# Patient Record
Sex: Female | Born: 1950
Health system: Southern US, Community
[De-identification: ages and names within clinical notes are randomized; demographics above are authoritative.]

## PROBLEM LIST (undated history)

## (undated) DIAGNOSIS — J45909 Unspecified asthma, uncomplicated: Secondary | ICD-10-CM

## (undated) DIAGNOSIS — R011 Cardiac murmur, unspecified: Secondary | ICD-10-CM

## (undated) DIAGNOSIS — I10 Essential (primary) hypertension: Principal | ICD-10-CM

## (undated) DIAGNOSIS — M810 Age-related osteoporosis without current pathological fracture: Secondary | ICD-10-CM

## (undated) DIAGNOSIS — G454 Transient global amnesia: Secondary | ICD-10-CM

## (undated) HISTORY — DX: Essential (primary) hypertension: I10

## (undated) HISTORY — PX: OTHER SURGICAL HISTORY: SHX169

## (undated) HISTORY — PX: COLONOSCOPY: SHX174

## (undated) HISTORY — DX: Unspecified asthma, uncomplicated: J45.909

## (undated) HISTORY — DX: Transient global amnesia: G45.4

## (undated) HISTORY — DX: Age-related osteoporosis without current pathological fracture: M81.0

---

## 2000-12-17 ENCOUNTER — Encounter: Admission: RE | Admit: 2000-12-17 | Discharge: 2000-12-17 | Payer: Self-pay | Admitting: Family Medicine

## 2000-12-17 ENCOUNTER — Encounter: Payer: Self-pay | Admitting: Family Medicine

## 2001-01-11 ENCOUNTER — Encounter: Admission: RE | Admit: 2001-01-11 | Discharge: 2001-01-11 | Payer: Self-pay | Admitting: Family Medicine

## 2001-01-11 ENCOUNTER — Encounter: Payer: Self-pay | Admitting: Family Medicine

## 2002-01-14 ENCOUNTER — Encounter: Payer: Self-pay | Admitting: Family Medicine

## 2002-01-14 ENCOUNTER — Encounter: Admission: RE | Admit: 2002-01-14 | Discharge: 2002-01-14 | Payer: Self-pay | Admitting: Family Medicine

## 2002-08-09 ENCOUNTER — Emergency Department (HOSPITAL_COMMUNITY): Admission: EM | Admit: 2002-08-09 | Discharge: 2002-08-09 | Payer: Self-pay | Admitting: *Deleted

## 2002-08-09 ENCOUNTER — Encounter: Payer: Self-pay | Admitting: *Deleted

## 2003-01-07 ENCOUNTER — Emergency Department (HOSPITAL_COMMUNITY): Admission: EM | Admit: 2003-01-07 | Discharge: 2003-01-07 | Payer: Self-pay | Admitting: Emergency Medicine

## 2003-01-07 ENCOUNTER — Encounter: Payer: Self-pay | Admitting: Emergency Medicine

## 2003-02-06 ENCOUNTER — Encounter: Admission: RE | Admit: 2003-02-06 | Discharge: 2003-02-06 | Payer: Self-pay | Admitting: *Deleted

## 2003-04-10 ENCOUNTER — Emergency Department (HOSPITAL_COMMUNITY): Admission: EM | Admit: 2003-04-10 | Discharge: 2003-04-11 | Payer: Self-pay | Admitting: Emergency Medicine

## 2003-04-11 ENCOUNTER — Emergency Department (HOSPITAL_COMMUNITY): Admission: AD | Admit: 2003-04-11 | Discharge: 2003-04-11 | Payer: Self-pay | Admitting: Family Medicine

## 2003-05-12 HISTORY — PX: NM MYOVIEW LTD: HXRAD82

## 2003-05-15 ENCOUNTER — Observation Stay (HOSPITAL_COMMUNITY): Admission: EM | Admit: 2003-05-15 | Discharge: 2003-05-16 | Payer: Self-pay | Admitting: Emergency Medicine

## 2003-05-23 ENCOUNTER — Emergency Department (HOSPITAL_COMMUNITY): Admission: EM | Admit: 2003-05-23 | Discharge: 2003-05-24 | Payer: Self-pay | Admitting: Emergency Medicine

## 2004-01-07 ENCOUNTER — Other Ambulatory Visit: Admission: RE | Admit: 2004-01-07 | Discharge: 2004-01-07 | Payer: Self-pay | Admitting: Internal Medicine

## 2004-03-08 ENCOUNTER — Encounter: Admission: RE | Admit: 2004-03-08 | Discharge: 2004-03-08 | Payer: Self-pay | Admitting: Internal Medicine

## 2004-05-10 ENCOUNTER — Ambulatory Visit (HOSPITAL_COMMUNITY): Admission: RE | Admit: 2004-05-10 | Discharge: 2004-05-10 | Payer: Self-pay | Admitting: Gastroenterology

## 2004-10-07 IMAGING — CT CT HEAD W/O CM
1 of 2 series · 13 of 30 positions shown, 17 images · non-contrast
Comparison: none

CLINICAL DATA: Elevated blood pressure.  Left-sided body numbness and weakness.  
CT OF THE HEAD WITHOUT CONTRAST
Routine unenhanced study was performed.  Comparison 01/07/03.  
There is no evidence of acute intracranial hemorrhage, mass effect or extraaxial fluid collection.  The ventricles and subarachnoid spaces are appropriately sized for age.  No stroke or white matter disease is demonstrated.  The visualized paranasal sinuses remain clear.  Calvarium is intact.  
IMPRESSION
Stable unenhanced CT of the head demonstrating no abnormality.  For further assessment of persistent symptomatology, MRI may be helpful.

[Series 2: brain · axial · 0.47mm/px · z∈[+171,+294]mm · 13 of 28 slices shown, 17 images]
[im 2/28  brain]
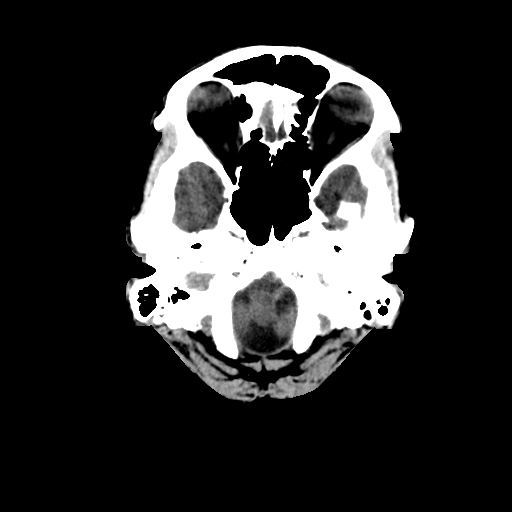
[im 2/28  bone]
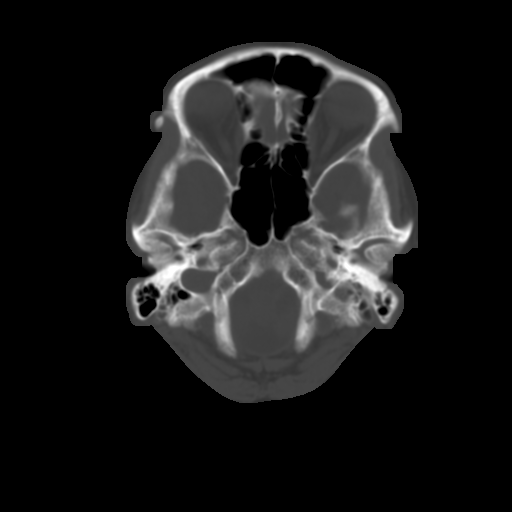
[im 4/28  brain]
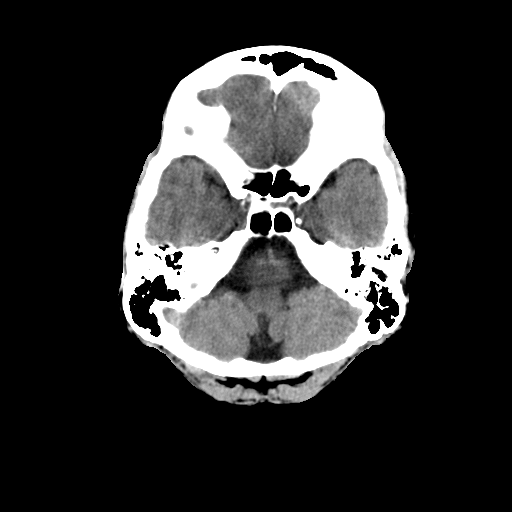
[im 6/28  brain]
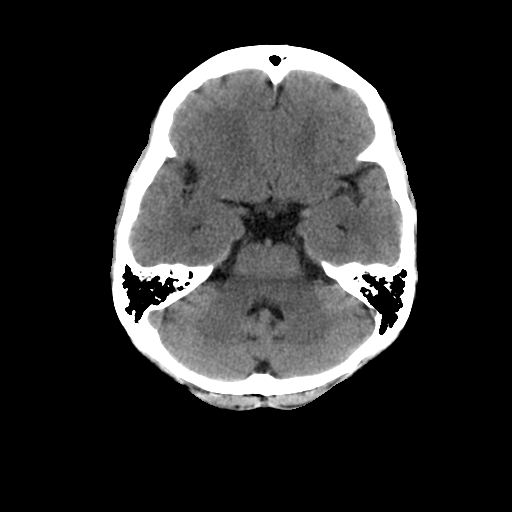
[im 8/28  brain]
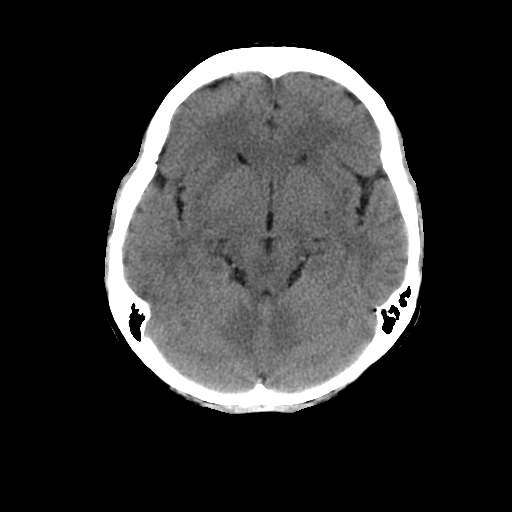
[im 10/28  brain]
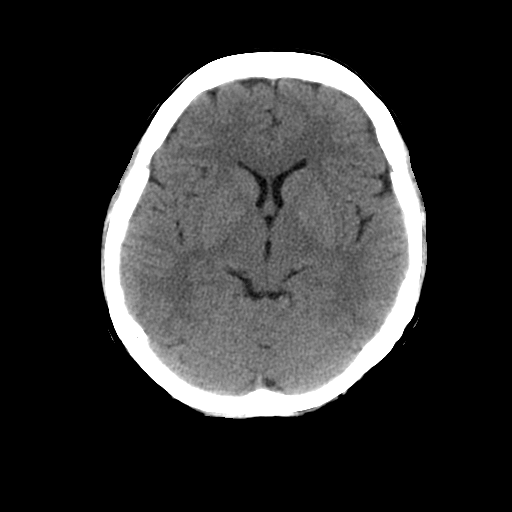
[im 10/28  bone]
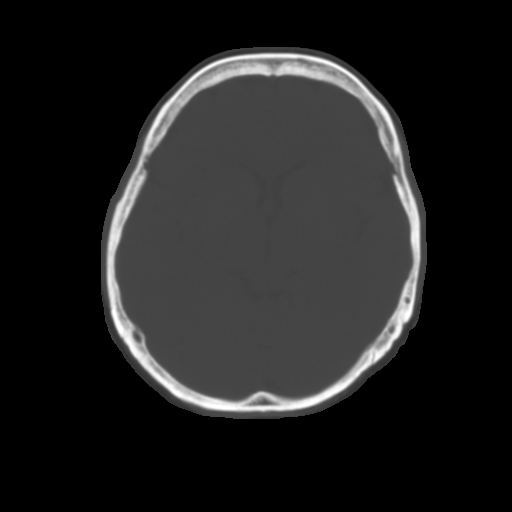
[im 12/28  brain]
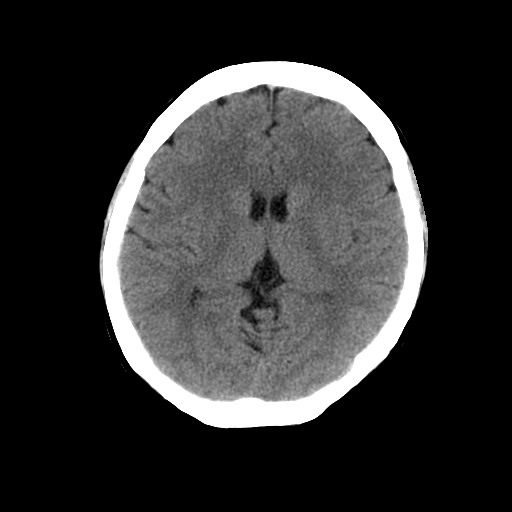
[im 14/28  brain]
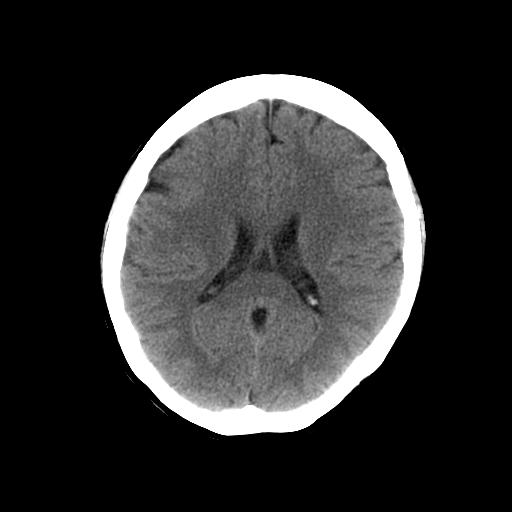
[im 16/28  brain]
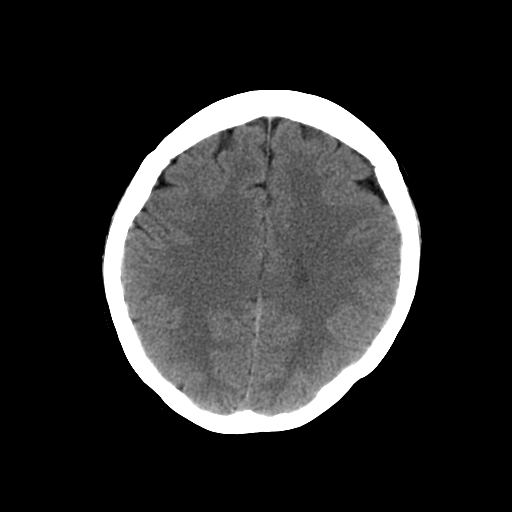
[im 18/28  brain]
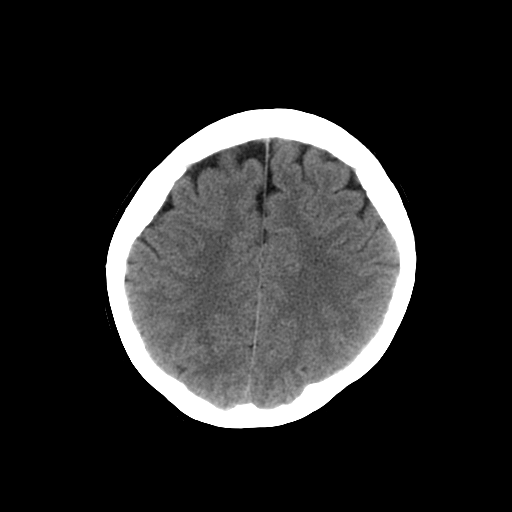
[im 18/28  bone]
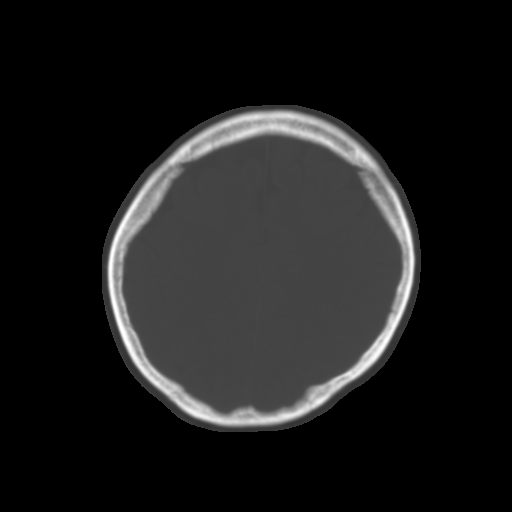
[im 20/28  brain]
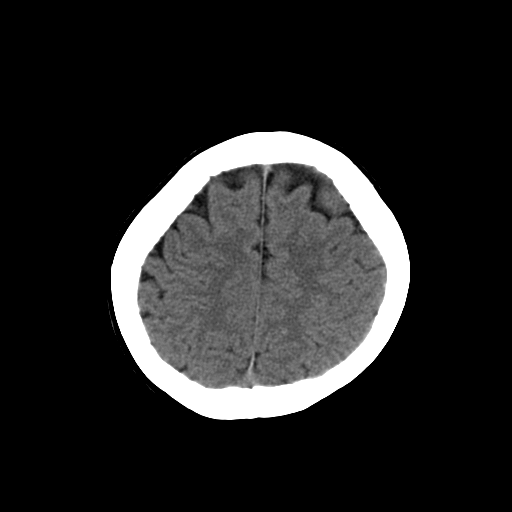
[im 22/28  brain]
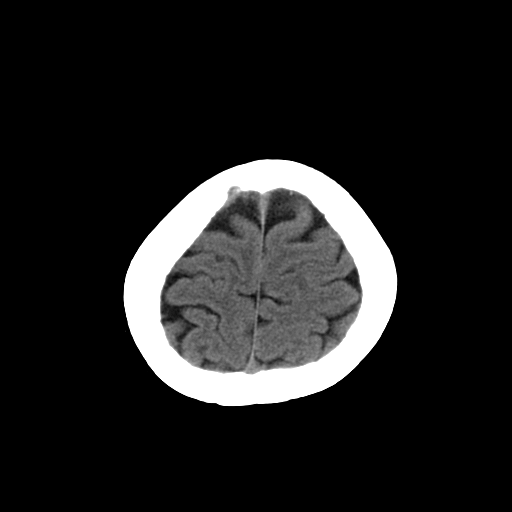
[im 24/28  brain]
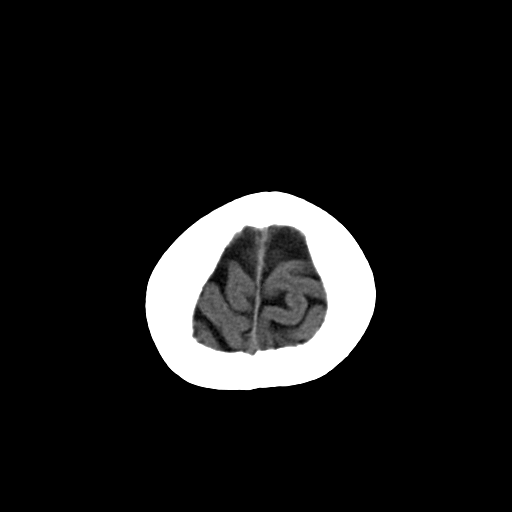
[im 26/28  brain]
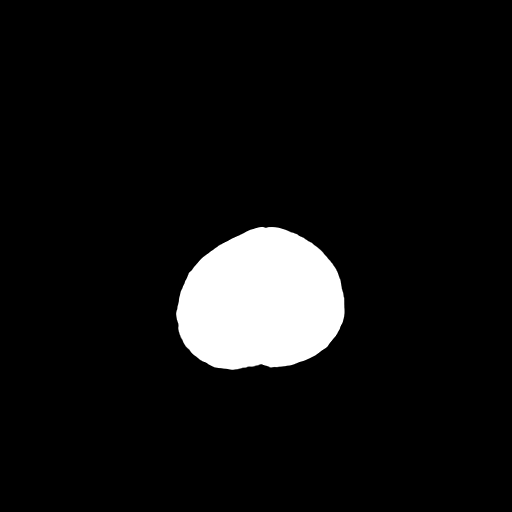
[im 26/28  bone]
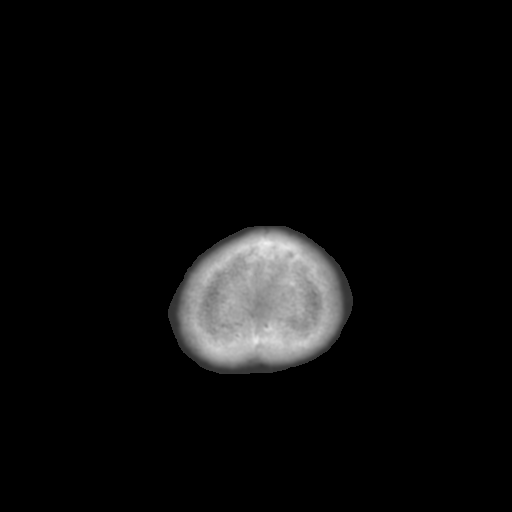

[13 of 30 positions shown; findings below may reference images not displayed]

## 2004-10-10 ENCOUNTER — Emergency Department (HOSPITAL_COMMUNITY): Admission: EM | Admit: 2004-10-10 | Discharge: 2004-10-10 | Payer: Self-pay | Admitting: Family Medicine

## 2005-02-07 ENCOUNTER — Other Ambulatory Visit: Admission: RE | Admit: 2005-02-07 | Discharge: 2005-02-07 | Payer: Self-pay | Admitting: Internal Medicine

## 2005-02-20 ENCOUNTER — Emergency Department (HOSPITAL_COMMUNITY): Admission: EM | Admit: 2005-02-20 | Discharge: 2005-02-20 | Payer: Self-pay | Admitting: Family Medicine

## 2005-03-09 ENCOUNTER — Encounter: Admission: RE | Admit: 2005-03-09 | Discharge: 2005-03-09 | Payer: Self-pay | Admitting: Internal Medicine

## 2005-03-30 ENCOUNTER — Encounter: Admission: RE | Admit: 2005-03-30 | Discharge: 2005-03-30 | Payer: Self-pay | Admitting: Internal Medicine

## 2005-05-03 ENCOUNTER — Emergency Department (HOSPITAL_COMMUNITY): Admission: EM | Admit: 2005-05-03 | Discharge: 2005-05-03 | Payer: Self-pay | Admitting: Family Medicine

## 2006-03-12 ENCOUNTER — Encounter: Admission: RE | Admit: 2006-03-12 | Discharge: 2006-03-12 | Payer: Self-pay | Admitting: Internal Medicine

## 2006-03-14 ENCOUNTER — Other Ambulatory Visit: Admission: RE | Admit: 2006-03-14 | Discharge: 2006-03-14 | Payer: Self-pay | Admitting: Internal Medicine

## 2006-05-01 ENCOUNTER — Emergency Department (HOSPITAL_COMMUNITY): Admission: EM | Admit: 2006-05-01 | Discharge: 2006-05-01 | Payer: Self-pay | Admitting: Family Medicine

## 2006-05-02 ENCOUNTER — Emergency Department (HOSPITAL_COMMUNITY): Admission: EM | Admit: 2006-05-02 | Discharge: 2006-05-02 | Payer: Self-pay | Admitting: Emergency Medicine

## 2006-10-06 ENCOUNTER — Emergency Department (HOSPITAL_COMMUNITY): Admission: EM | Admit: 2006-10-06 | Discharge: 2006-10-06 | Payer: Self-pay | Admitting: Family Medicine

## 2007-03-14 ENCOUNTER — Encounter: Admission: RE | Admit: 2007-03-14 | Discharge: 2007-03-14 | Payer: Self-pay | Admitting: *Deleted

## 2007-04-11 LAB — CONVERTED CEMR LAB: Pap Smear: NORMAL

## 2008-03-25 ENCOUNTER — Encounter: Payer: Self-pay | Admitting: Family Medicine

## 2008-03-25 ENCOUNTER — Encounter: Admission: RE | Admit: 2008-03-25 | Discharge: 2008-03-25 | Payer: Self-pay | Admitting: Family Medicine

## 2008-08-26 ENCOUNTER — Encounter: Payer: Self-pay | Admitting: Family Medicine

## 2008-08-31 ENCOUNTER — Encounter: Payer: Self-pay | Admitting: Family Medicine

## 2008-10-27 DIAGNOSIS — M810 Age-related osteoporosis without current pathological fracture: Secondary | ICD-10-CM

## 2008-10-27 DIAGNOSIS — J45909 Unspecified asthma, uncomplicated: Secondary | ICD-10-CM | POA: Insufficient documentation

## 2008-10-27 DIAGNOSIS — I10 Essential (primary) hypertension: Secondary | ICD-10-CM

## 2008-10-27 HISTORY — DX: Age-related osteoporosis without current pathological fracture: M81.0

## 2008-10-27 HISTORY — DX: Unspecified asthma, uncomplicated: J45.909

## 2008-10-27 HISTORY — DX: Essential (primary) hypertension: I10

## 2008-11-08 HISTORY — PX: TRANSTHORACIC ECHOCARDIOGRAM: SHX275

## 2008-11-11 ENCOUNTER — Ambulatory Visit: Payer: Self-pay | Admitting: Family Medicine

## 2008-11-21 ENCOUNTER — Ambulatory Visit: Payer: Self-pay | Admitting: Internal Medicine

## 2008-11-21 ENCOUNTER — Inpatient Hospital Stay (HOSPITAL_COMMUNITY): Admission: EM | Admit: 2008-11-21 | Discharge: 2008-11-22 | Payer: Self-pay | Admitting: Emergency Medicine

## 2008-11-23 ENCOUNTER — Ambulatory Visit: Payer: Self-pay | Admitting: Family Medicine

## 2008-11-24 ENCOUNTER — Ambulatory Visit (HOSPITAL_COMMUNITY): Admission: RE | Admit: 2008-11-24 | Discharge: 2008-11-24 | Payer: Self-pay | Admitting: Internal Medicine

## 2008-11-24 DIAGNOSIS — G454 Transient global amnesia: Secondary | ICD-10-CM | POA: Insufficient documentation

## 2008-11-24 HISTORY — DX: Transient global amnesia: G45.4

## 2008-11-24 LAB — CONVERTED CEMR LAB
BUN: 11 mg/dL (ref 6–23)
Creatinine, Ser: 0.7 mg/dL (ref 0.4–1.2)
Potassium: 4.9 meq/L (ref 3.5–5.1)

## 2008-11-25 ENCOUNTER — Ambulatory Visit: Payer: Self-pay | Admitting: Family Medicine

## 2008-11-27 ENCOUNTER — Telehealth: Payer: Self-pay | Admitting: Family Medicine

## 2008-11-30 ENCOUNTER — Encounter: Payer: Self-pay | Admitting: Family Medicine

## 2008-12-03 ENCOUNTER — Telehealth: Payer: Self-pay | Admitting: Family Medicine

## 2008-12-07 ENCOUNTER — Encounter: Payer: Self-pay | Admitting: Cardiology

## 2008-12-07 ENCOUNTER — Ambulatory Visit: Payer: Self-pay

## 2008-12-07 ENCOUNTER — Encounter (INDEPENDENT_AMBULATORY_CARE_PROVIDER_SITE_OTHER): Payer: Self-pay | Admitting: Diagnostic Neuroimaging

## 2009-03-24 ENCOUNTER — Ambulatory Visit: Payer: Self-pay | Admitting: Family Medicine

## 2009-03-26 ENCOUNTER — Encounter: Admission: RE | Admit: 2009-03-26 | Discharge: 2009-03-26 | Payer: Self-pay | Admitting: Family Medicine

## 2009-09-14 ENCOUNTER — Ambulatory Visit: Payer: Self-pay | Admitting: Family Medicine

## 2009-09-14 LAB — CONVERTED CEMR LAB
ALT: 18 units/L (ref 0–35)
AST: 20 units/L (ref 0–37)
Albumin: 4.2 g/dL (ref 3.5–5.2)
Alkaline Phosphatase: 54 units/L (ref 39–117)
BUN: 11 mg/dL (ref 6–23)
Basophils Absolute: 0 10*3/uL (ref 0.0–0.1)
Basophils Relative: 0.4 % (ref 0.0–3.0)
Bilirubin Urine: NEGATIVE
Bilirubin, Direct: 0.1 mg/dL (ref 0.0–0.3)
CO2: 32 meq/L (ref 19–32)
Calcium: 9.7 mg/dL (ref 8.4–10.5)
Chloride: 105 meq/L (ref 96–112)
Cholesterol: 193 mg/dL (ref 0–200)
Creatinine, Ser: 0.6 mg/dL (ref 0.4–1.2)
Eosinophils Absolute: 0 10*3/uL (ref 0.0–0.7)
Eosinophils Relative: 0.2 % (ref 0.0–5.0)
GFR calc non Af Amer: 117.87 mL/min (ref >60)
Glucose, Bld: 89 mg/dL (ref 70–99)
Glucose, Urine, Semiquant: NEGATIVE
HCT: 36.4 % (ref 36.0–46.0)
HDL: 86.1 mg/dL (ref >39.00)
Hemoglobin: 12.4 g/dL (ref 12.0–15.0)
Ketones, urine, test strip: NEGATIVE
LDL Cholesterol: 98 mg/dL (ref 0–99)
Lymphocytes Relative: 16.1 % (ref 12.0–46.0)
Lymphs Abs: 1.2 10*3/uL (ref 0.7–4.0)
MCHC: 34.1 g/dL (ref 30.0–36.0)
MCV: 94 fL (ref 78.0–100.0)
Monocytes Absolute: 0.2 10*3/uL (ref 0.1–1.0)
Monocytes Relative: 3.4 % (ref 3.0–12.0)
Neutro Abs: 5.8 10*3/uL (ref 1.4–7.7)
Neutrophils Relative %: 79.9 % — ABNORMAL HIGH (ref 43.0–77.0)
Nitrite: NEGATIVE
Platelets: 305 10*3/uL (ref 150.0–400.0)
Potassium: 5 meq/L (ref 3.5–5.1)
Protein, U semiquant: NEGATIVE
RBC: 3.87 M/uL (ref 3.87–5.11)
RDW: 13.8 % (ref 11.5–14.6)
Sodium: 144 meq/L (ref 135–145)
Specific Gravity, Urine: 1.015
TSH: 0.82 microintl units/mL (ref 0.35–5.50)
Total Bilirubin: 0.9 mg/dL (ref 0.3–1.2)
Total CHOL/HDL Ratio: 2
Total Protein: 6.7 g/dL (ref 6.0–8.3)
Triglycerides: 45 mg/dL (ref 0.0–149.0)
Urobilinogen, UA: 0.2
VLDL: 9 mg/dL (ref 0.0–40.0)
WBC: 7.3 10*3/uL (ref 4.5–10.5)
pH: 7

## 2009-09-21 ENCOUNTER — Ambulatory Visit: Payer: Self-pay | Admitting: Family Medicine

## 2009-10-13 ENCOUNTER — Telehealth: Payer: Self-pay | Admitting: Family Medicine

## 2010-02-09 ENCOUNTER — Ambulatory Visit: Payer: Self-pay | Admitting: Family Medicine

## 2010-02-14 ENCOUNTER — Encounter: Admission: RE | Admit: 2010-02-14 | Discharge: 2010-02-14 | Payer: Self-pay | Admitting: Obstetrics and Gynecology

## 2010-02-16 ENCOUNTER — Ambulatory Visit: Payer: Self-pay | Admitting: Family Medicine

## 2010-02-17 LAB — CONVERTED CEMR LAB: Vit D, 25-Hydroxy: 38 ng/mL (ref 30–89)

## 2010-03-28 ENCOUNTER — Encounter
Admission: RE | Admit: 2010-03-28 | Discharge: 2010-03-28 | Payer: Self-pay | Source: Home / Self Care | Attending: Obstetrics and Gynecology | Admitting: Obstetrics and Gynecology

## 2010-04-07 IMAGING — CR DG CHEST 2V
2 series · 2 of 2 positions shown · non-contrast
Comparison: 05/02/2006

CLINICAL DATA: Dizziness.  Altered mental status.  Confusion.

CHEST - 2 VIEW

[w chest lat]
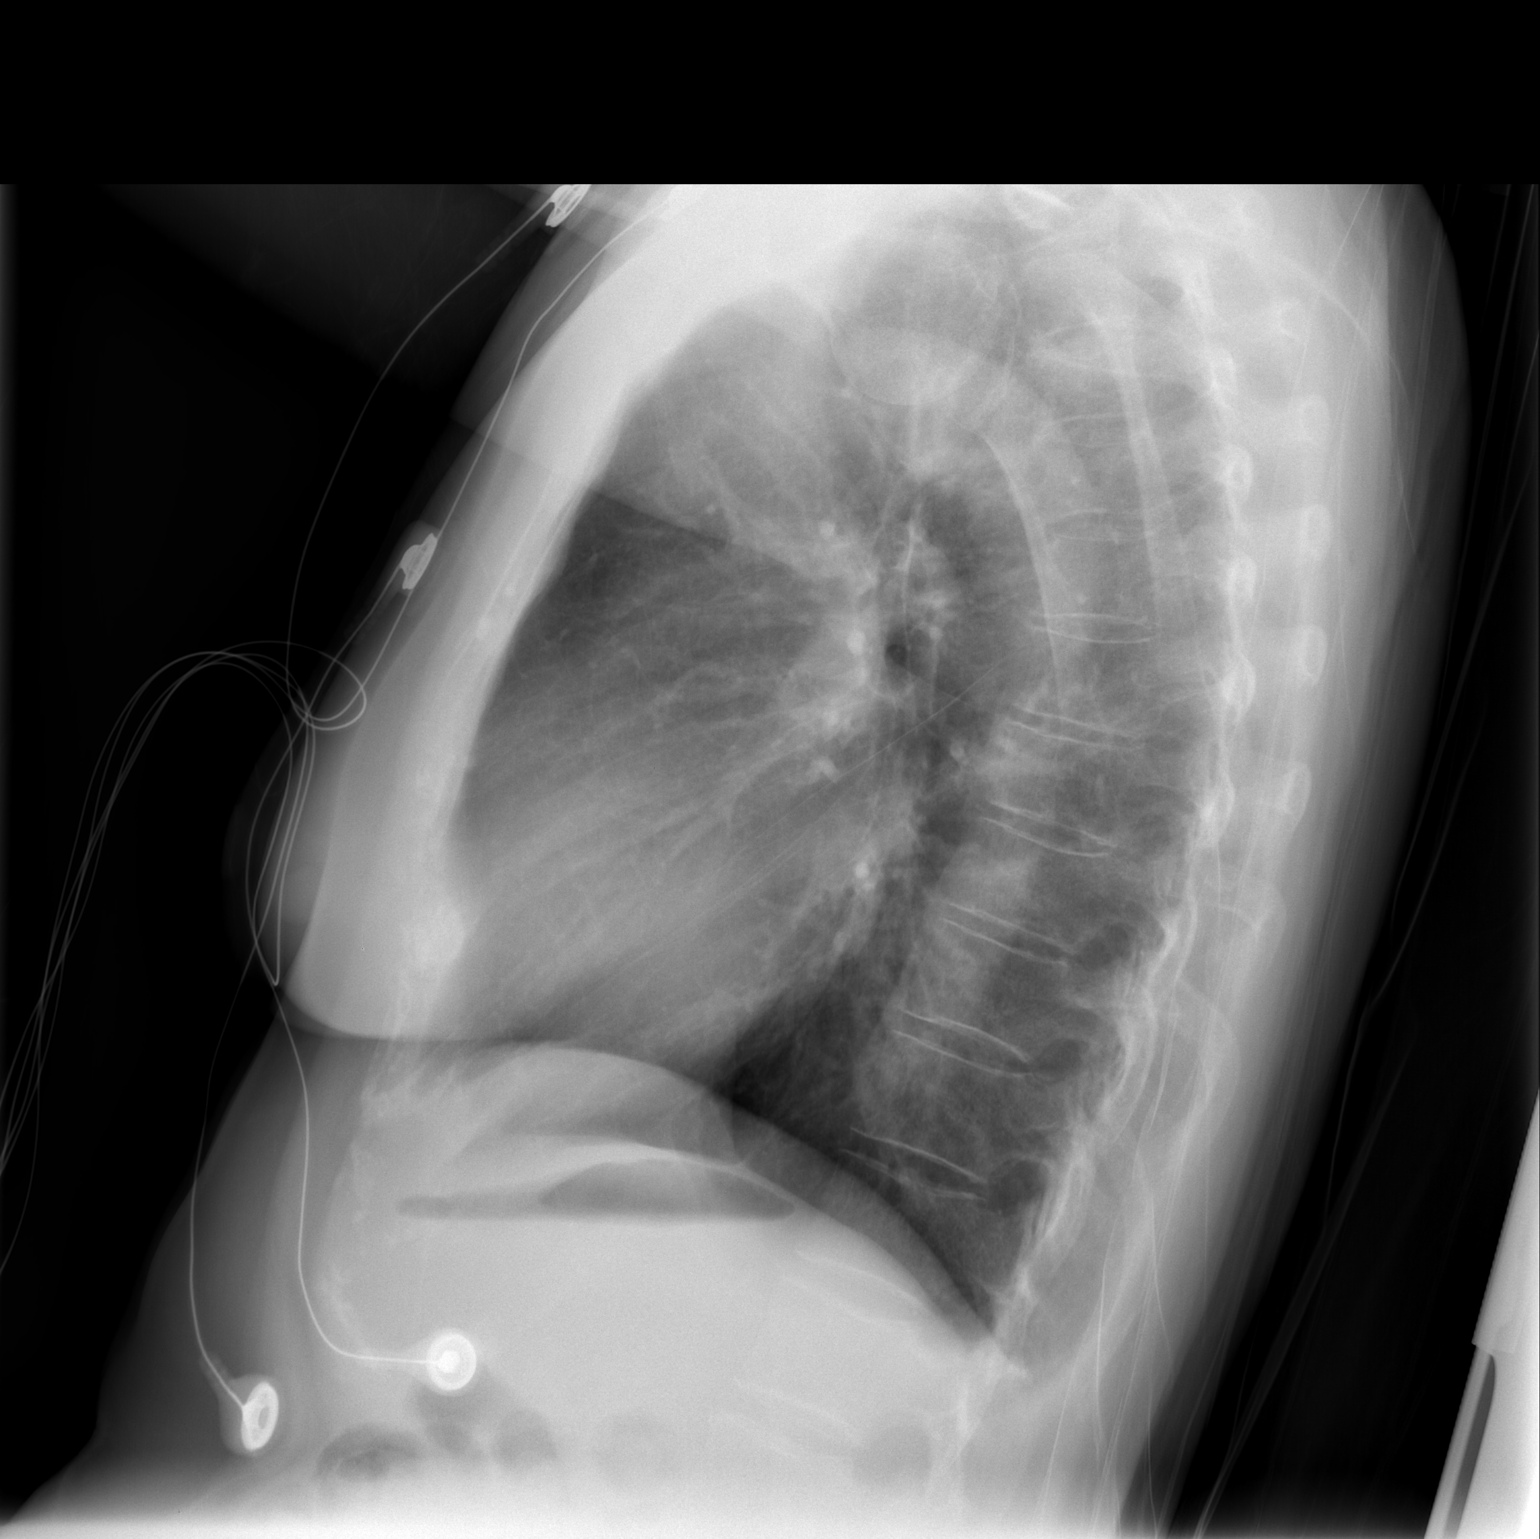

[view not recorded]
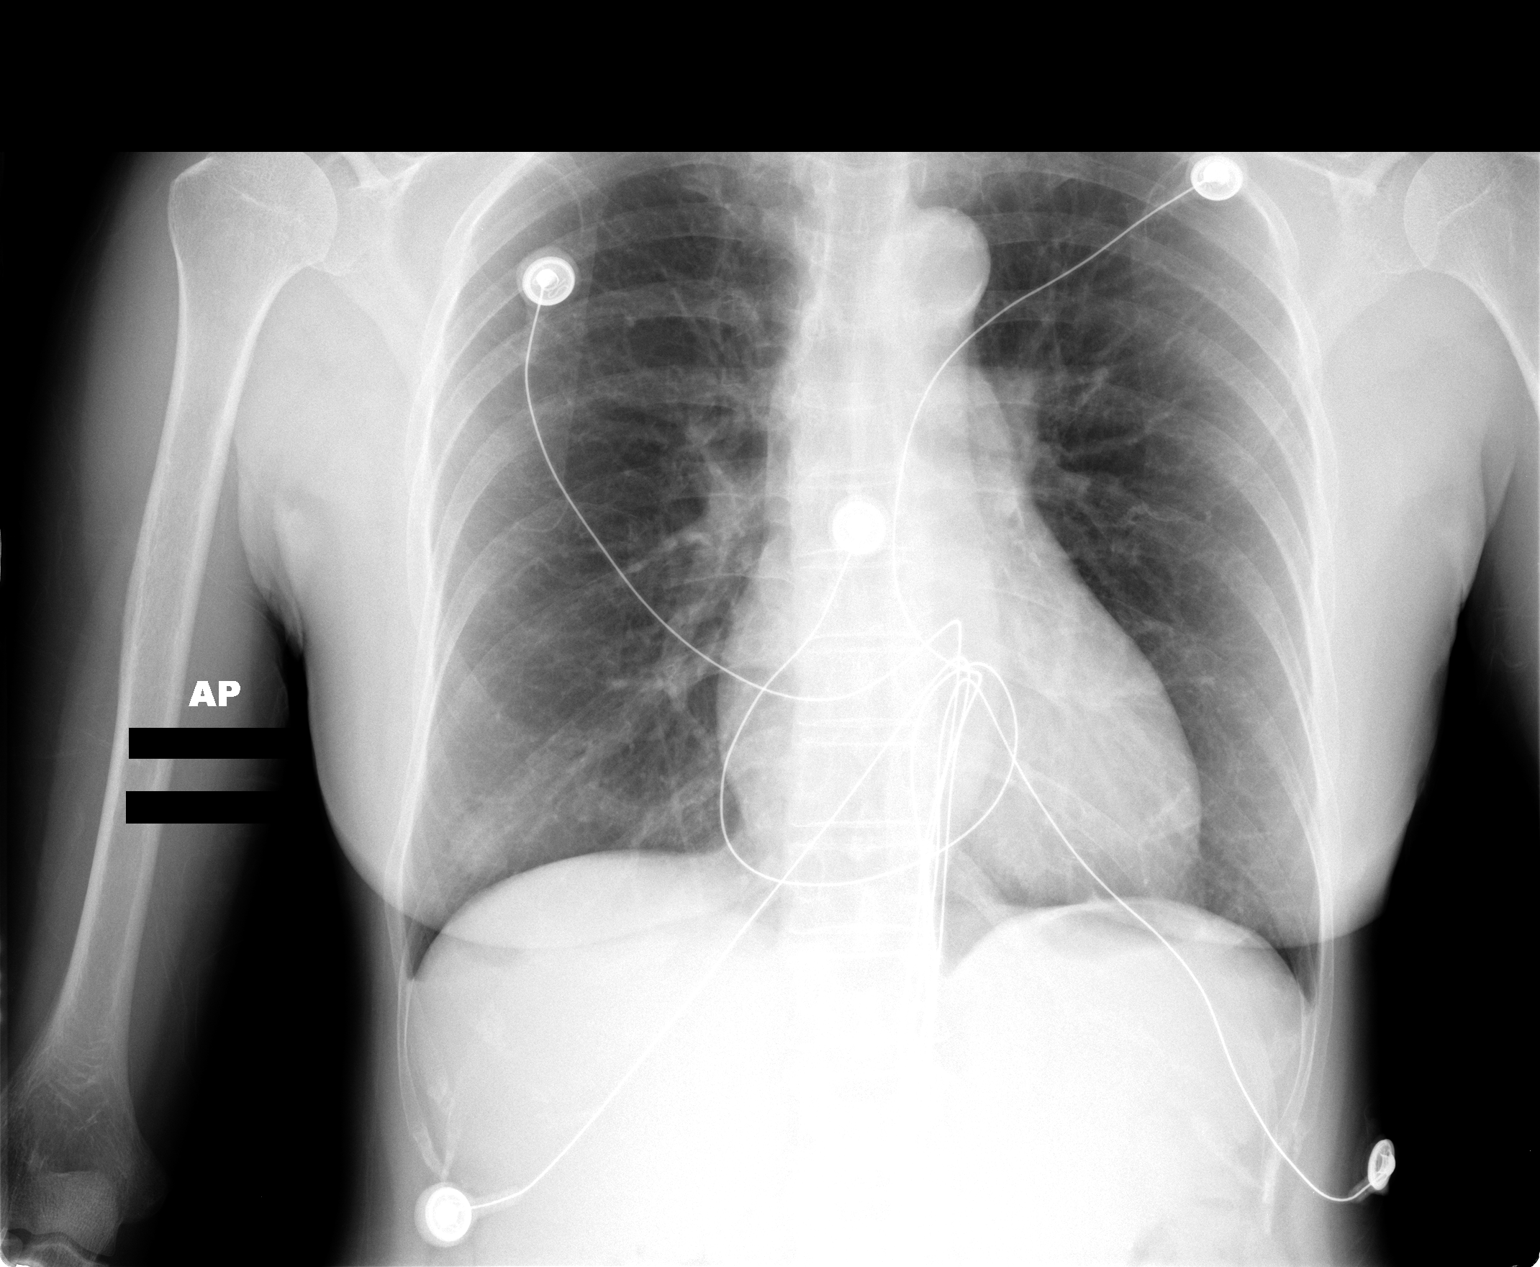

[2 of 2 positions shown; findings below may reference images not displayed]

FINDINGS: Heart size and mediastinal contours are normal.  Both
lungs are clear.  There is no evidence of pleural effusion.  No
mass or adenopathy identified. Nipple shadow seen overlying the
right lung base.
IMPRESSION: No active cardiopulmonary disease.

## 2010-04-30 ENCOUNTER — Encounter: Payer: Self-pay | Admitting: Internal Medicine

## 2010-05-10 NOTE — Progress Notes (Signed)
Summary: med refill Amlodipine X 1 year  Phone Note Call from Patient Call back at Home Phone [phone]   Caller: Patient Call For: Julie Peat MD Summary of Call: pt need refill on amlodipine besylate 2.5mg  [facility] [phone] Initial call taken by: Heron Sabins,  October 13, 2009 12:44 PM    Prescriptions: AMLODIPINE BESYLATE 2.5 MG TABS (AMLODIPINE BESYLATE) one by mouth daily  #30 x 11   Entered by:   Sid Falcon LPN   Authorized by:   Julie Peat MD   Signed by:   Sid Falcon LPN on 47/82/9562   Method used:   [facility] [address]       [address]       [place], [place]  [address]       Ph: [phone] or [phone]       Fax: [phone]   RxID:   [id number]

## 2010-05-10 NOTE — Assessment & Plan Note (Signed)
Summary: CPX/NO PAP/RCD   Vital Signs:  Patient profile:   60 year old female Menstrual status:  postmenopausal Height:      64 inches Weight:      111 pounds BMI:     19.12 Temp:     97.8 degrees F oral Pulse rate:   72 / minute Pulse rhythm:   regular Resp:     12 per minute BP sitting:   140 / 100  (left arm) Cuff size:   regular  Vitals Entered By: Sid Falcon LPN (September 21, 2009 8:59 AM)  Serial Vital Signs/Assessments:  Time      Position  BP       Pulse  Resp  Temp     By                     158/90                         Evelena Peat MD  CC: CPX, labs done   History of Present Illness: Here for CPE.  Hypertension.  Treated with amlodipine, low dose. Hx Transient Global Amnesia one year ago with no episodes since then.  Last pap 2009 and normal.  Mammogram 12/10 and normal.  DEXA 2007 ?osteopenia. tetanus 2005.  Colonoscopy 2005 normal. Walks for exercise.  Pt prefers to establish with gyn for pap smears.  Preventive Screening-Counseling & Management  Alcohol-Tobacco     Smoking Status: never  Allergies: 1)  ! Pcn 2)  ! Aspirin  Past History:  Family History: Last updated: 09/21/2009 Mother CVA 28 Sister Breast Cancer Brother Type 2 diabetes  Social History: Last updated: 09/21/2009 Alcohol use-yes Drug use-no Regular exercise-no Never Smoked  Risk Factors: Exercise: no (10/27/2008)  Risk Factors: Smoking Status: never (09/21/2009)  Past Medical History:  Hypertension Osteoporosis  Transient Global Amnesia 2010  Family History: Mother CVA 77 Sister Breast Cancer Brother Type 2 diabetes  Social History: Alcohol use-yes Drug use-no Regular exercise-no Never Smoked Smoking Status:  never  Review of Systems  The patient denies anorexia, fever, weight loss, weight gain, vision loss, decreased hearing, hoarseness, chest pain, syncope, dyspnea on exertion, peripheral edema, prolonged cough, headaches, hemoptysis, abdominal  pain, melena, hematochezia, severe indigestion/heartburn, hematuria, incontinence, genital sores, muscle weakness, suspicious skin lesions, transient blindness, difficulty walking, depression, unusual weight change, abnormal bleeding, enlarged lymph nodes, and breast masses.    Physical Exam  General:  Well-developed,well-nourished,in no acute distress; alert,appropriate and cooperative throughout examination Head:  Normocephalic and atraumatic without obvious abnormalities. No apparent alopecia or balding. Eyes:  No corneal or conjunctival inflammation noted. EOMI. Perrla. Funduscopic exam benign, without hemorrhages, exudates or papilledema. Vision grossly normal. Ears:  minimal cerumen R canal o/w normal. Mouth:  Oral mucosa and oropharynx without lesions or exudates.  Teeth in good repair. Neck:  No deformities, masses, or tenderness noted. Breasts:  No mass, nodules, thickening, tenderness, bulging, retraction, inflamation, nipple discharge or skin changes noted.   Lungs:  Normal respiratory effort, chest expands symmetrically. Lungs are clear to auscultation, no crackles or wheezes. Heart:  Normal rate and regular rhythm. S1 and S2 normal without gallop, murmur, click, rub or other extra sounds. Abdomen:  Bowel sounds positive,abdomen soft and non-tender without masses, organomegaly or hernias noted. Genitalia:  per gyn Msk:  No deformity or scoliosis noted of thoracic or lumbar spine.   Extremities:  No clubbing, cyanosis, edema, or deformity noted with normal full range of  motion of all joints.   Neurologic:  No cranial nerve deficits noted. Station and gait are normal. Plantar reflexes are down-going bilaterally. DTRs are symmetrical throughout. Sensory, motor and coordinative functions appear intact. Skin:  no rashes and no suspicious lesions.   Cervical Nodes:  No lymphadenopathy noted Psych:  Cognition and judgment appear intact. Alert and cooperative with normal attention span and  concentration. No apparent delusions, illusions, hallucinations   Impression & Recommendations:  Problem # 1:  Preventive Health Care (ICD-V70.0) labs reviewed with pt and all favorable.  Immunizations are up to date.  She will establish with Gyn.  Needs repeat DEXA and will get with mammogram next fall.  Complete Medication List: 1)  Calcium-vitamin D 500-125 Mg-unit Tabs (Calcium-vitamin d) 2)  Fish Oil 500 Mg Caps (Omega-3 fatty acids) 3)  Amlodipine Besylate 2.5 Mg Tabs (Amlodipine besylate) .... One by mouth daily 4)  Vit V 500  .... Once daily 5)  Vit E Iu  .... Once daily  Patient Instructions: 1)  It is important that you exercise reguarly at least 20 minutes 5 times a week. If you develop chest pain, have severe difficulty breathing, or feel very tired, stop exercising immediately and seek medical attention.  2)  Take calcium +vitamin D daily.  3)  We need to schedule DEXA scan by later this year. 4)  Ophthallmologists-  Dr Mateo Flow  [phone])  OB-GYNs  Dr Elvina Mattes [phone])                    Dr Huel Cote  [phone]

## 2010-05-10 NOTE — Assessment & Plan Note (Signed)
Summary: flu shot/2pm/njr  Nurse Visit   Allergies: 1)  ! Pcn 2)  ! Aspirin  Orders Added: 1)  Admin 1st Vaccine [90471] 2)  Flu Vaccine 49yrs + [01601]        Flu Vaccine Consent Questions     Do you have a history of severe allergic reactions to this vaccine? no    Any prior history of allergic reactions to egg and/or gelatin? no    Do you have a sensitivity to the preservative Thimersol? no    Do you have a past history of Guillan-Barre Syndrome? no    Do you currently have an acute febrile illness? no    Have you ever had a severe reaction to latex? no    Vaccine information given and explained to patient? yes    Are you currently pregnant? no    Lot Number:[id number]   Exp Date:10/08/2010   Site Given  Left Deltoid IM

## 2010-05-10 NOTE — Assessment & Plan Note (Signed)
Summary: Review DEXA results/nn   Vital Signs:  Patient profile:   60 year old female Menstrual status:  postmenopausal Weight:      115 pounds Temp:     98.0 degrees F oral BP sitting:   140 / 92  (left arm) Cuff size:   regular  Vitals Entered By: Sid Falcon LPN (February 16, 2010 1:27 PM)  History of Present Illness: Patient here to discuss recent DEXA scan results. Significant for osteoporosis range with T score -3.2 spine  and -1.1 hip. Patient previously on bisphosphonate she thinks but she cannot recall which one but did have some GI upset and only took briefly.   She takes regular calcium and vitamin D. No history of fracture. Nonsmoker. No hx of excessive ETOH or caffeine.    Regular exercise with walking.  No consistent upper body exercises.  Allergies: 1)  ! Pcn 2)  ! Aspirin  Past History:  Past Medical History: Last updated: 09/21/2009  Hypertension Osteoporosis  Transient Global Amnesia 2010  Physical Exam  General:  Well-developed,well-nourished,in no acute distress; alert,appropriate and cooperative throughout examination Lungs:  Normal respiratory effort, chest expands symmetrically. Lungs are clear to auscultation, no crackles or wheezes. Heart:  Normal rate and regular rhythm. S1 and S2 normal without gallop, murmur, click, rub or other extra sounds. Msk:  No deformity or scoliosis noted of thoracic or lumbar spine.     Impression & Recommendations:  Problem # 1:  OSTEOPOROSIS (ICD-733.00)  check vitamin D level. Discussed adequate calcium and vitamin D supplementation as well as exercise, esp addition of upper body. One more trial of generic Fosamax. If intolerant consider other options such as reclast or prolia. Her updated medication list for this problem includes:    Calcium-vitamin D 500-125 Mg-unit Tabs (Calcium-vitamin d)    Alendronate Sodium 70 Mg Tabs (Alendronate sodium) ..... One by mouth once weekly  Orders: T-Vitamin D  (25-Hydroxy) [phone]) Venipuncture (51884)  Complete Medication List: 1)  Calcium-vitamin D 500-125 Mg-unit Tabs (Calcium-vitamin d) 2)  Fish Oil 500 Mg Caps (Omega-3 fatty acids) 3)  Amlodipine Besylate 2.5 Mg Tabs (Amlodipine besylate) .... One by mouth daily 4)  Vit V 500  .... Once daily 5)  Vit E Iu  .... Once daily 6)  Alendronate Sodium 70 Mg Tabs (Alendronate sodium) .... One by mouth once weekly  Patient Instructions: 1)  It is important that you exercise reguarly at least 20 minutes 5 times a week. If you develop chest pain, have severe difficulty breathing, or feel very tired, stop exercising immediately and seek medical attention.  2)  Take calcium +vitamin D daily.  Prescriptions: ALENDRONATE SODIUM 70 MG TABS (ALENDRONATE SODIUM) one by mouth once weekly  #4 x 11   Entered and Authorized by:   Evelena Peat MD   Signed by:   Evelena Peat MD on 02/16/2010   Method used:   [facility] [address]       [address]       [place], [place]  [address]       Ph: [phone] or [phone]       Fax: [phone]   RxID:   [id number]    Orders Added: 1)  T-Vitamin D (25-Hydroxy) [phone] 2)  Venipuncture [03500] 3)  Est. Patient Level III [93818]

## 2010-07-17 LAB — COMPREHENSIVE METABOLIC PANEL
ALT: 18 U/L (ref 0–35)
AST: 21 U/L (ref 0–37)
Albumin: 4 g/dL (ref 3.5–5.2)
Alkaline Phosphatase: 60 U/L (ref 39–117)
BUN: 8 mg/dL (ref 6–23)
CO2: 29 mEq/L (ref 19–32)
Calcium: 9 mg/dL (ref 8.4–10.5)
Chloride: 100 mEq/L (ref 96–112)
Creatinine, Ser: 0.62 mg/dL (ref 0.4–1.2)
GFR calc Af Amer: >60 mL/min (ref >60)
GFR calc non Af Amer: >60 mL/min (ref >60)
Glucose, Bld: 130 mg/dL — ABNORMAL HIGH (ref 70–99)
Potassium: 3.2 mEq/L — ABNORMAL LOW (ref 3.5–5.1)
Sodium: 136 mEq/L (ref 135–145)
Total Bilirubin: 0.8 mg/dL (ref 0.3–1.2)
Total Protein: 7.3 g/dL (ref 6.0–8.3)

## 2010-07-17 LAB — CSF CULTURE W GRAM STAIN
Culture: NO GROWTH
Gram Stain: NONE SEEN

## 2010-07-17 LAB — URINALYSIS, ROUTINE W REFLEX MICROSCOPIC
Bilirubin Urine: NEGATIVE
Glucose, UA: NEGATIVE mg/dL
Hgb urine dipstick: NEGATIVE
Ketones, ur: NEGATIVE mg/dL
Nitrite: NEGATIVE
Protein, ur: NEGATIVE mg/dL
Specific Gravity, Urine: 1.008 (ref 1.005–1.030)
Urobilinogen, UA: 0.2 mg/dL (ref 0.0–1.0)
pH: 8 (ref 5.0–8.0)

## 2010-07-17 LAB — CSF CELL COUNT WITH DIFFERENTIAL: WBC, CSF: 0 /mm3 (ref 0–5)

## 2010-07-17 LAB — PROTEIN, CSF: Total  Protein, CSF: 33 mg/dL (ref 15–45)

## 2010-07-17 LAB — URINE MICROSCOPIC-ADD ON

## 2010-07-17 LAB — AMMONIA: Ammonia: 8 umol/L — ABNORMAL LOW (ref 11–35)

## 2010-07-21 ENCOUNTER — Ambulatory Visit (INDEPENDENT_AMBULATORY_CARE_PROVIDER_SITE_OTHER): Payer: Self-pay | Admitting: Family Medicine

## 2010-07-21 ENCOUNTER — Encounter: Payer: Self-pay | Admitting: Family Medicine

## 2010-07-21 VITALS — BP 160/100 | Temp 98.8°F | Wt 115.0 lb

## 2010-07-21 DIAGNOSIS — N644 Mastodynia: Secondary | ICD-10-CM

## 2010-07-21 NOTE — Patient Instructions (Signed)
Followup promptly for any breast mass, skin changes, nipple discharge, or nipple inversion

## 2010-07-21 NOTE — Progress Notes (Signed)
  Subjective:    Patient ID: Julie Savage, female    DOB: 11-Aug-1950, 60 y.o.   MRN: [id number]  HPI Patient seen with some mild breast discomfort right breast Friday. This was retroverted very older. She has not noticed any masses, nipple discharge, rash, redness, or any adenopathy. Had mammogram in December which was normal. No history of breast cancer. No chest pain. No dyspnea. No cough. Pain is actually improved at this time   Review of Systems  Constitutional: Negative for fever, chills, fatigue and unexpected weight change.  Respiratory: Negative for cough and shortness of breath.   Skin: Negative for rash.       Objective:   Physical Exam  Constitutional: She appears well-developed and well-nourished.  Cardiovascular: Normal rate, regular rhythm and normal heart sounds.   No murmur heard. Pulmonary/Chest: Effort normal and breath sounds normal. No respiratory distress. She has no wheezes. She has no rales.       Right breast is examined. No nipple inversion. No redness or erythema or warmth. No masses palpated. No tenderness. No axillary or chest wall adenopathy  Psychiatric: She has a normal mood and affect.          Assessment & Plan:  Right breast pain which was transient with normal exam at this time and recent normal mammogram. Observe for now.

## 2010-08-23 NOTE — H&P (Signed)
Julie Savage, Julie Savage              ACCOUNT NO.:  [id number]   MEDICAL RECORD NO.:  [id number]          PATIENT TYPE:  INP   LOCATION:  1517                         FACILITY[facility]  [facility]   PHYSICIAN:  Gordy Savers, MDDATE OF BIRTH:  1950/06/18   DATE OF ADMISSION:  11/20/2008  DATE OF DISCHARGE:                              HISTORY & PHYSICAL   CHIEF COMPLAINT:  Confusion.   HISTORY OF PRESENT ILLNESS:  The patient is a 60 year old female with a  three to four year history of hypertension who has enjoyed excellent  health.  The patient was stable until approximately 9:30 on the evening  of admission when she began to have confusion.  Her husband relates  significant short-term memory loss.  She would repeatedly ask the same  question, poor orientation.  In the hospital for instance, she would  frequently ask why she was in the hospital and what her difficulty was.  She also seemed to have some difficulty with more remote memory.  For  example her daughter has recently returned from the [place] several  weeks ago and the patient could not remember her daughter's fairly  recent trip.  For the past two weeks the patient has also been having  headaches.  There has been no fever, sinus drainage.  ED evaluation  included a head CT that was within normal limits, it visualized the  paranasal sinuses and mastoids were clear.  An LP was performed in the  emergency department, results pending at this time.  The patient is now  admitted for evaluation of suspected transient global amnesia.   PAST MEDICAL HISTORY:  1. The patient has hypertension of three to four year's duration.  2. She has a history of asthma and osteoporosis.   MEDICAL REGIMEN:  1. Norvasc 2.5 mg daily.  2. Fish oil supplements.  3. Vitamin D.  4. Calcium supplements.   SOCIAL HISTORY:  She is married.  One child from her first marriage and  a son and daughter from her second marriage.  She is a gravida 3, para  3, abortus zero.  Nondrinker, nonsmoker, homemaker.   FAMILY HISTORY:  Noncontributory.  Father died of cancer, unclear type.  Mother age 57, has a history of cerebrovascular disease and hemiparesis.  She has hypertension.  Three brothers and four sisters positive for  hypertension and asthma.   ALLERGIES:  ASPIRIN AND PENICILLIN.   REVIEW OF SYSTEMS:  Exam is otherwise negative except as mentioned in  the history of present illness.   EXAMINATION:  Blood pressure on arrival 188/108, present blood pressure  140/80, O2 saturation 99%.  SKIN:  Warm and dry without rash.  HEAD AND NECK:  Revealed no signs of trauma, pupil responses were  normal.  Extraocular muscles were full.  Tympanic membranes were normal.  No localized sinus tenderness.  Oropharynx clear.  Neck no bruits or  adenopathy, neck was supple, no meningismus.  CHEST:  Clear.  CARDIOVASCULAR:  Normal S1 - S2, no murmurs or gallops.  ABDOMEN:  Benign, no organomegaly.  EXTREMITIES:  No edema, peripheral pulses were full.  NEURO:  Revealed the patient be alert although mildly confused, short-  term memory was extremely impaired.  She was not able to name the year  but did know the month was August and the season was summer.  She was  able to name all four seasons of the year.  Cranial nerve examination  revealed normal pupil responses, conjunctivae clear, extraocular muscles  were full.  There was no facial asymmetry.  Tongue and uvula were  midline.  Shoulder shrug was normal.  There was no drift to the  outstretched arms.  Grip strength was normal.  Finger to nose and heel-  to-shin testing performed normally.  No pathological reflexes were  demonstrated.   IMPRESSION:  1. Probable transient global amnesia.  2. Headaches of two week's duration.  3. Hypertension.   DISPOSITION:  Will review the results of the lumbar puncture, presumably  this will be normal.  We will admit to the hospital for further  evaluation.   Depending on her clinical response, will consider Neurology  evaluation and possible brain MRI.      Gordy Savers, MD  Electronically Signed     PFK/MEDQ  D:  11/21/2008  T:  11/21/2008  Job:  [id number]

## 2010-08-23 NOTE — Discharge Summary (Signed)
Julie Savage, Julie Savage              ACCOUNT NO.:  [id number]   MEDICAL RECORD NO.:  [id number]          PATIENT TYPE:  INP   LOCATION:  1517                         FACILITY:  [facility]   PHYSICIAN:  Titus Dubin. Hopper, MD,FACP,FCCPDATE OF BIRTH:  10/31/1950   DATE OF ADMISSION:  11/20/2008  DATE OF DISCHARGE:  11/22/2008                               DISCHARGE SUMMARY   ADMITTING DIAGNOSES:  1. Probable transient global amnesia.  2. Headaches of 2 weeks' duration.  3. Hypertension.   DISCHARGE DIAGNOSES:  1. Probable transient global amnesia.  2. Headaches of 2 weeks' duration.  3. Hypertension.  4. Hypokalemia.   HOSPITAL COURSE:  For details of the history and physical, please see  dictated notes by Dr. Amador Cunas August 13.  The date of admission, she  became somewhat confused with significant short-term memory loss  according to her husband.   In the emergency room, CT was performed, which was normal.  LP was done.  She had 0 white cells and red cells on the LP.  Culture was negative at  the time of discharge.  Her potassium was 3.2 and random glucose 130.  Otherwise, the comprehensive metabolic profile was normal.  Serum  ammonia was low at 8.  CSF glucose was also normal at 63.  Total protein  within normal limits at 33.   The patient's symptoms rapidly improved.  On the day of discharge, she  was oriented x3.  She did exhibit significant hypertension in the  hospital with blood pressures as high as 167/95.  Her amlodipine was  increased to 2.5 mg twice a day.  She is not on a diuretic to explain  the hypokalemia.   On the day of discharge, her neurologic exam was unremarkable.  Blood  pressure was 125/81, respiratory rate 20, and pulse 78 and regular.  O2  saturations were 100% on room air.   There was no history to suggest pheochromocytoma.  Particularly, she had  not had the constellation of headaches, chest pain, flushing, and  diarrhea.  She did give a history of  having had a previous MRI of the  brain several years prior for what proved to be anxiety.   Because of the hypokalemia in the absence of diuretics, fasting cortisol  will be checked along with serum potassium.  Dr. Caryl Never will schedule  MRI as an outpatient.  Her husband has requested a neurologic  consultation, and this can  be scheduled as well.  The patient was to return should she have  recurrence of her symptoms.  There was no change in her home meds except  to increase the Norvasc to 2.5 mg twice a day.  Otherwise, she is on  vitamin D, calcium supplements, and fish oil supplements.      Titus Dubin. Alwyn Ren, MD,FACP,FCCP  Electronically Signed     WFH/MEDQ  D:  11/22/2008  T:  11/22/2008  Job:  [id number]   cc:   Evelena Peat, M.D.

## 2010-08-26 NOTE — H&P (Signed)
NAME:  Julie Savage, Julie Savage                        ACCOUNT NO.:  [id number]   MEDICAL RECORD NO.:  [id number]                   PATIENT TYPE:  INP   LOCATION:  1823                                 FACILITY:  [facility]   PHYSICIAN:  Darius Bump, M.D.             DATE OF BIRTH:  February 24, 1951   DATE OF ADMISSION:  05/15/2003  DATE OF DISCHARGE:                                HISTORY & PHYSICAL   IDENTIFICATION:  A 60 year old woman with chest pain.   HISTORY OF PRESENT ILLNESS:  Bao Coreas is a pleasant 60 year old woman  with a history of labile hypertension who was out walking this morning and  developed exertional chest pain and pressure.  She felt heaviness in her  chest and shortness of breath.  Did not feel diaphoretic and no nausea or  vomiting.  Has noticed occasional exertional episodes of chest pain in the  past few weeks but not as severe.  Has slight pain initially in the office  which subsided over the half hour that she spent here.   PAST MEDICAL HISTORY:  1. Hypertension, labile for a few months.  Blood pressure at home tend to     run very well, in our office as high as 170/100.  2. ER visit for difficulty breathing in September 2004.  It was felt     ultimately to be due to anxiety.  Labs at that time did show an elevated     blood sugar of 224 and a BUN of 48.  However, the accuracy of these     results was questioned as she was seen shortly thereafter in our office     on September 30; her A1C was 5.2; her glucose was 96, and her BUN was 12     with a creatinine of 0.7.  3. Menopausal.  4. ? anxiety disorder.  In past have tried her on Zoloft, but she felt like     a zombie on it and quit taking it.  5 . Episode of hypothyroidism in the past followed by an endocrinologist  here in town; apparently thyroid tests normalized without longstanding  treatment.  Due for thyroid testing.   ALLERGIES:  PENICILLIN and ASPIRIN.  Aspirin has caused swelling and rash.   MEDICATIONS:  Toprol XL 25 mg a day.   PAST SURGICAL HISTORY:  Dental surgery only.   FAMILY HISTORY:  There is significant hypertension in the family including  sisters and mother.  No history of premature CAD.   SOCIAL HISTORY:  She is [place] from [place] married, and has  two children.  Has lived in [place], [place], and now here.  No tobacco,  occasional alcohol.   REVIEW OF SYSTEMS:  No fever or chills. Weight has been stable.   PHYSICAL EXAMINATION:  VITAL SIGNS:  Blood pressure 158/100, heart rate 66.  Weight 162.  HEENT:  Pupils equal, round, and reactive to light.  Extraocular muscles  intact.  Sclerae clear, disks flat.  NECK:  Without JVD or bruits.  LUNGS:  Clear.  HEART:  Regular rhythm and rate.  No murmur or gallop heard.  ABDOMEN:  Soft, nontender, with normal bowel sounds.  EXTREMITIES:  No edema and 2+ pulses.  RECTAL:  Exam was done, and there was no stool in the vault, but smear  negative.   EKG shows normal sinus rhythm with a rate of 61, normal axis and intervals,  nonspecific ST changes.   IMPRESSION:  A 60 year old woman with a history of labile hypertension, now  with exertional chest pain, worrisome for being unstable angina that has  been worsened over the past few weeks.  Other possible causes include  gastroesophageal reflux disease or anxiety.  We did give her a sublingual  nitroglycerin in the office, and her chest pain improved, but it is  uncertain if that was just passage of time or due to the nitroglycerin.  She  will be admitted to a monitored bed. We will place her on Lovenox, due to  her ASPIRIN allergy, and metoprolol.  Check CKs and troponin and obtain a  cardiology consultation.  I am going to put her on Protonix empirically.  If  cardiac workup is negative, recommend trial of increased anxiolytics.                                                Darius Bump, M.D.    MJM/MEDQ  D:  05/15/2003  T:  05/15/2003   Job:  [id number]

## 2010-08-26 NOTE — Op Note (Signed)
[name], [name]              ACCOUNT NO.:  [id number]   MEDICAL RECORD NO.:  [id number]          PATIENT TYPE:  AMB   LOCATION:  ENDO                         FACILITY:  [facility]   PHYSICIAN:  Danise Edge, M.D.   DATE OF BIRTH:  11/19/1950   DATE OF PROCEDURE:  05/10/2004  DATE OF DISCHARGE:                                 OPERATIVE REPORT   PROCEDURE:  Screening colonoscopy.   PROCEDURE INDICATIONS:  Ms. Julie Savage is a 60 year old female born  [birth year].  Ms. Julie Savage is scheduled to undergo her first screening  colonoscopy with polypectomy to prevent colon cancer.   ENDOSCOPIST:  Danise Edge, M.D.   PREMEDICATION:  Versed 6 mg, Demerol 60 mg.   PROCEDURE:  After obtaining informed consent, Julie Savage was placed in the  left lateral decubitus position.  I administered intravenous Demerol and  intravenous Versed to achieve conscious sedation for the procedure.  The  patient's blood pressure, oxygen saturation and cardiac rhythm were  monitored throughout the procedure and documented the medical record.   Anal inspection and digital rectal exam were normal.  The Olympus adjustable  pediatric colonoscope was introduced into the rectum and advanced to the  cecum.  Colonic preparation for the exam today was excellent.   Rectum normal.  Sigmoid colon and descending colon normal.  Splenic flexure normal.  Transverse colon normal.  Hepatic flexure normal.  Ascending colon normal.  Cecum and ileocecal valve normal.   ASSESSMENT:  Normal screening proctocolonoscopy to cecum.     MJ/MEDQ  D:  05/10/2004  T:  05/10/2004  Job:  [id number]   cc:   Darius Bump, M.D.  [place] [address]  [place] [address]  Fax: [phone]

## 2010-08-26 NOTE — Consult Note (Signed)
NAME:  Julie, Savage                        ACCOUNT NO.:  [id number]   MEDICAL RECORD NO.:  [id number]                   PATIENT TYPE:  INP   LOCATION:  4733                                 FACILITY:  [facility]   PHYSICIAN:  Meade Maw, M.D.                 DATE OF BIRTH:  [birth year]   DATE OF CONSULTATION:  05/15/2003  DATE OF DISCHARGE:                                   CONSULTATION   REASON FOR CONSULTATION:  Chest pain.   HISTORY:  Julie Savage is a pleasant 60 year old female with past medical  history of uncontrolled hypertension. She has been experiencing exertional  heavy chest pressure for the past month.  She has had three episodes.  The  first two episodes were brief in nature, persisting for 15-30 minutes and  mild to moderate severity.  On the day of admission she experienced a  prolonged episode of chest pressure which occurred 15-20 minutes within her  walk, was severe in nature and associated with shortness of breath.  She  subsequently called EMS and was admitted for further evaluation.  There was  no associated nausea, vomiting, or diaphoresis.  She has had no presyncope,  no syncope.  She notes that she has recently been diagnosed with  hypertension and this has caused her some stress.  She has been tried on  several medications without success, the last being Toprol which has  resulted in her being fatigued.  Her coronary risk factors are significant  for postmenopausal status, hypertension, no history of dyslipidemia, no  family history.   PAST MEDICAL HISTORY:  Significant for:  1. Hypertension.  2. Hypothyroidism in the past, resolved per patient.  3. Anxiety, panic disorder.   MEDICATIONS AT ADMISSION:  Toprol XL 25 mg daily.   ALLERGIES:  1. PENICILLIN.  2. ASPIRIN.  She states she has had swelling and a rash with aspirin.   FAMILY HISTORY:  Father passed from cancer.  Mother is alive, has arthritis.  She has one brother living who has diabetes,  one sister who has been  diagnosed with breast cancer.   REVIEW OF SYSTEMS:  She notes increased gas.  She has also had increased  stress related to her teen-age daughter.  Occasionally feels palpitations.  She has had mild dizziness with the initiation of Toprol.  Review of systems  is otherwise negative.   PHYSICAL EXAMINATION:  VITAL SIGNS:  Blood pressure 161/95, heart rate 74,  respiratory rate 16, temperature 99, O2 saturation 98% on room air.  GENERAL:  Middle-age white female who is somewhat anxious but in no acute  distress.  She is currently pain-free.  HEENT:  Unremarkable.  NECK:  She has good carotid upstrokes, no carotid bruits.  Thyroid is not  palpable.  No lymphadenopathy.  PULMONARY:  Breath sounds are equal and clear to auscultation.  No use of  accessory muscles.  CARDIOVASCULAR:  Regular rate and  rhythm.  No rubs, murmurs or gallops  noted.  ABDOMEN:  Soft, benign, nontender, no unusual bruits or pulsations are  noted.  EXTREMITIES:  Distal pulses which are 2+ bilaterally.  SKIN:  Warm and dry.  NEUROLOGIC:  Nonfocal.   LABORATORY DATA:  Heme test per Dr. Daphine Deutscher negative.  EKG reveals a sinus  rhythm, normal axis, normal EKG.  White count is 4.8, hemoglobin 12,  platelet count 319.  Normal electrolytes.  Normal serial enzymes.  Troponin-  I 0.01.   IMPRESSION:  57. A 60 year old female with risk factors including postmenopausal status     and hypertension.  Chest pain is typical for angina in that it occurs     with exertion.  However, the patient has continued with her exercise     program without predictable chest pain.  She will be scheduled for a     stress Cardiolite if serial enzymes remain negative.  2. Hypertension.  Will defer this to her primary in that the patient is     unable to name what previous medications she has been on for     hypertension.  3. Anxiety disorder.  The patient probably would benefit from anxiolytic     therapy.                                                Meade Maw, M.D.    HP/MEDQ  D:  05/15/2003  T:  05/16/2003  Job:  [id number]

## 2010-09-29 ENCOUNTER — Other Ambulatory Visit: Payer: Self-pay | Admitting: Family Medicine

## 2010-09-30 ENCOUNTER — Other Ambulatory Visit: Payer: Self-pay | Admitting: Family Medicine

## 2010-10-26 ENCOUNTER — Ambulatory Visit (INDEPENDENT_AMBULATORY_CARE_PROVIDER_SITE_OTHER): Payer: BC Managed Care – PPO | Admitting: Family Medicine

## 2010-10-26 ENCOUNTER — Encounter: Payer: Self-pay | Admitting: Family Medicine

## 2010-10-26 DIAGNOSIS — I10 Essential (primary) hypertension: Secondary | ICD-10-CM

## 2010-10-26 DIAGNOSIS — R21 Rash and other nonspecific skin eruption: Secondary | ICD-10-CM

## 2010-10-26 MED ORDER — AMLODIPINE BESYLATE 2.5 MG PO TABS: 2.5000 mg | ORAL_TABLET | Freq: Every day | ORAL | Status: DC

## 2010-10-26 NOTE — Progress Notes (Signed)
  Subjective:    Patient ID: Julie Savage, female    DOB: 10/12/1950, 60 y.o.   MRN: [id number]  HPI Facial rash right cheek region. Noted about 4 months ago. Possibly enlarging. No pain or itching. No scaling. No vesicles. No other areas of rash noted. She's not tried anything in terms of topical creams.  Hypertension treated with amlodipine 2.5 mg daily. Consistent good home blood pressures around 116/70. No headaches or dizziness. Feels anxious in doctor's office. Question of white coat syndrome   Review of Systems  Constitutional: Negative for fatigue.  Eyes: Negative for visual disturbance.  Respiratory: Negative for cough, chest tightness, shortness of breath and wheezing.   Cardiovascular: Negative for chest pain, palpitations and leg swelling.  Skin: Positive for rash.  Neurological: Negative for dizziness, seizures, syncope, weakness, light-headedness and headaches.       Objective:   Physical Exam  Constitutional: She is oriented to person, place, and time. She appears well-developed and well-nourished. No distress.  Eyes: Pupils are equal, round, and reactive to light.  Cardiovascular: Normal rate, regular rhythm and normal heart sounds.   No murmur heard. Pulmonary/Chest: Effort normal and breath sounds normal. No respiratory distress. She has no wheezes. She has no rales.  Musculoskeletal: She exhibits no edema.  Neurological: She is alert and oriented to person, place, and time.  Skin:       Patient has a subtle somewhat circumferential swelling right cheek region. No scaling. No pigmentary change. No pustules or vesicles.          Assessment & Plan:  #1 skin rash. Question early granuloma annulare. No other skin lesions noted. Dermatologist for second opinion #2 hypertension with likely white coat syndrome. Consistent good blood pressure at home. Refill medication for one year. Bring her cuff for recheck here.

## 2010-10-26 NOTE — Patient Instructions (Signed)
We will call you with dermatology appointment. Be in touch if your blood pressure is >140/90.

## 2011-01-20 ENCOUNTER — Other Ambulatory Visit: Payer: Self-pay | Admitting: Family Medicine

## 2011-01-27 ENCOUNTER — Other Ambulatory Visit: Payer: Self-pay | Admitting: Family Medicine

## 2011-01-27 DIAGNOSIS — Z1231 Encounter for screening mammogram for malignant neoplasm of breast: Secondary | ICD-10-CM

## 2011-04-06 ENCOUNTER — Ambulatory Visit
Admission: RE | Admit: 2011-04-06 | Discharge: 2011-04-06 | Disposition: A | Discharge status: Home / Self Care | Payer: BC Managed Care – PPO | Source: Ambulatory Visit | Attending: Family Medicine | Admitting: Family Medicine

## 2011-04-06 DIAGNOSIS — Z1231 Encounter for screening mammogram for malignant neoplasm of breast: Secondary | ICD-10-CM

## 2011-05-25 ENCOUNTER — Other Ambulatory Visit: Payer: Self-pay | Admitting: *Deleted

## 2011-05-25 MED ORDER — ALENDRONATE SODIUM 70 MG PO TABS: 70.0000 mg | ORAL_TABLET | ORAL | Status: DC

## 2011-05-25 MED ORDER — AMLODIPINE BESYLATE 2.5 MG PO TABS: 2.5000 mg | ORAL_TABLET | Freq: Every day | ORAL | Status: DC

## 2012-02-21 ENCOUNTER — Other Ambulatory Visit (INDEPENDENT_AMBULATORY_CARE_PROVIDER_SITE_OTHER): Payer: BC Managed Care – PPO

## 2012-02-21 DIAGNOSIS — Z0000 Encounter for general adult medical examination without abnormal findings: Secondary | ICD-10-CM

## 2012-02-21 LAB — CBC WITH DIFFERENTIAL/PLATELET
Basophils Relative: 0.7 % (ref 0.0–3.0)
Eosinophils Absolute: 0 10*3/uL (ref 0.0–0.7)
HCT: 39.4 % (ref 36.0–46.0)
Hemoglobin: 12.9 g/dL (ref 12.0–15.0)
Lymphocytes Relative: 32.8 % (ref 12.0–46.0)
Lymphs Abs: 1.2 10*3/uL (ref 0.7–4.0)
MCHC: 32.6 g/dL (ref 30.0–36.0)
Monocytes Relative: 4.5 % (ref 3.0–12.0)
Neutro Abs: 2.2 10*3/uL (ref 1.4–7.7)
RBC: 4.23 Mil/uL (ref 3.87–5.11)
RDW: 13.4 % (ref 11.5–14.6)

## 2012-02-21 LAB — BASIC METABOLIC PANEL
BUN: 16 mg/dL (ref 6–23)
CO2: 29 mEq/L (ref 19–32)
Calcium: 9.2 mg/dL (ref 8.4–10.5)
Chloride: 102 mEq/L (ref 96–112)
Creatinine, Ser: 0.6 mg/dL (ref 0.4–1.2)

## 2012-02-21 LAB — TSH: TSH: 1.17 u[IU]/mL (ref 0.35–5.50)

## 2012-02-21 LAB — HEPATIC FUNCTION PANEL
Alkaline Phosphatase: 47 U/L (ref 39–117)
Bilirubin, Direct: 0.1 mg/dL (ref 0.0–0.3)
Total Bilirubin: 0.9 mg/dL (ref 0.3–1.2)

## 2012-02-21 LAB — LIPID PANEL
LDL Cholesterol: 103 mg/dL — ABNORMAL HIGH (ref 0–99)
Total CHOL/HDL Ratio: 2

## 2012-03-01 ENCOUNTER — Other Ambulatory Visit: Payer: Self-pay | Admitting: Family Medicine

## 2012-03-01 ENCOUNTER — Ambulatory Visit (INDEPENDENT_AMBULATORY_CARE_PROVIDER_SITE_OTHER): Payer: BC Managed Care – PPO | Admitting: Family Medicine

## 2012-03-01 ENCOUNTER — Encounter: Payer: Self-pay | Admitting: Family Medicine

## 2012-03-01 ENCOUNTER — Other Ambulatory Visit: Payer: BC Managed Care – PPO

## 2012-03-01 VITALS — BP 170/88 | HR 72 | Temp 97.9°F | Resp 12 | Ht 63.75 in | Wt 114.0 lb

## 2012-03-01 DIAGNOSIS — M810 Age-related osteoporosis without current pathological fracture: Secondary | ICD-10-CM

## 2012-03-01 DIAGNOSIS — Z299 Encounter for prophylactic measures, unspecified: Secondary | ICD-10-CM

## 2012-03-01 DIAGNOSIS — Z803 Family history of malignant neoplasm of breast: Secondary | ICD-10-CM

## 2012-03-01 DIAGNOSIS — Z1231 Encounter for screening mammogram for malignant neoplasm of breast: Secondary | ICD-10-CM

## 2012-03-01 DIAGNOSIS — Z0000 Encounter for general adult medical examination without abnormal findings: Secondary | ICD-10-CM

## 2012-03-01 DIAGNOSIS — I10 Essential (primary) hypertension: Secondary | ICD-10-CM

## 2012-03-01 MED ORDER — AMLODIPINE BESYLATE 2.5 MG PO TABS: 2.5000 mg | ORAL_TABLET | Freq: Every day | ORAL | Status: DC

## 2012-03-01 MED ORDER — TETANUS-DIPHTH-ACELL PERTUSSIS 5-2.5-18.5 LF-MCG/0.5 IM SUSP: 0.5000 mL | Freq: Once | INTRAMUSCULAR | Status: DC

## 2012-03-01 MED ORDER — ALENDRONATE SODIUM 70 MG PO TABS: 70.0000 mg | ORAL_TABLET | ORAL | Status: DC
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
## 2012-03-01 NOTE — Patient Instructions (Addendum)
We need to schedule repeat bone density scan Continue daily calcium and vitamin D supplementation Consider shingles vaccine Check on date of last colonoscopy Continue to monitor blood pressure closely and be in touch if consistently greater than 140/90 by home readings

## 2012-03-04 ENCOUNTER — Other Ambulatory Visit: Payer: BC Managed Care – PPO

## 2012-03-04 ENCOUNTER — Ambulatory Visit (INDEPENDENT_AMBULATORY_CARE_PROVIDER_SITE_OTHER)
Admission: RE | Admit: 2012-03-04 | Discharge: 2012-03-04 | Disposition: A | Discharge status: Home / Self Care | Payer: BC Managed Care – PPO | Source: Ambulatory Visit

## 2012-03-04 ENCOUNTER — Other Ambulatory Visit: Payer: Self-pay | Admitting: *Deleted

## 2012-03-04 DIAGNOSIS — M810 Age-related osteoporosis without current pathological fracture: Secondary | ICD-10-CM

## 2012-03-11 ENCOUNTER — Encounter: Payer: BC Managed Care – PPO | Admitting: Family Medicine

## 2012-03-12 NOTE — Progress Notes (Signed)
Quick Note:  Pt informed ______ 

## 2012-03-26 ENCOUNTER — Ambulatory Visit: Payer: BC Managed Care – PPO | Admitting: Family Medicine

## 2012-03-26 DIAGNOSIS — Z299 Encounter for prophylactic measures, unspecified: Secondary | ICD-10-CM

## 2012-03-26 MED ORDER — ZOSTER VACCINE LIVE 19400 UNT/0.65ML ~~LOC~~ SOLR: 0.6500 mL | Freq: Once | SUBCUTANEOUS | Status: DC

## 2012-05-10 ENCOUNTER — Ambulatory Visit
Admission: RE | Admit: 2012-05-10 | Discharge: 2012-05-10 | Disposition: A | Discharge status: Home / Self Care | Payer: BC Managed Care – PPO | Source: Ambulatory Visit | Attending: Family Medicine | Admitting: Family Medicine

## 2012-05-10 DIAGNOSIS — Z803 Family history of malignant neoplasm of breast: Secondary | ICD-10-CM

## 2012-05-10 DIAGNOSIS — Z1231 Encounter for screening mammogram for malignant neoplasm of breast: Secondary | ICD-10-CM

## 2012-07-05 ENCOUNTER — Encounter: Payer: Self-pay | Admitting: Family Medicine

## 2012-07-05 ENCOUNTER — Ambulatory Visit (INDEPENDENT_AMBULATORY_CARE_PROVIDER_SITE_OTHER): Payer: BC Managed Care – PPO | Admitting: Family Medicine

## 2012-07-05 VITALS — BP 130/100 | Temp 97.0°F | Wt 116.0 lb

## 2012-07-05 DIAGNOSIS — L820 Inflamed seborrheic keratosis: Secondary | ICD-10-CM

## 2012-07-05 NOTE — Patient Instructions (Addendum)
Seborrheic Keratosis  Seborrheic keratosis is a common, noncancerous (benign) skin growth that can occur anywhere on the skin. It looks like "stuck-on," waxy, rough, tan, brown, or black spots on the skin. These skin growths can be flat or raised. They are often called "barnacles" because of their pasted-on appearance. Usually, these skin growths appear in adulthood, around age 62, and increase in number as you age. They may also develop during pregnancy or following estrogen therapy. Many people may only have one growth appear in their lifetime, while some people may develop many growths.  CAUSES  It is unknown what causes these skin growths, but they appear to run in families.  SYMPTOMS  Seborrheic keratosis is often located on the face, chest, shoulders, back, or other areas. These growths are:  · Usually painless, but may become irritated and itchy.  · Yellow, brown, black, or other colors.  · Slightly raised or have a flat surface.  · Sometimes rough or wart-like in texture.  · Often waxy on the surface.  · Round or oval-shaped.  · Sometimes "stuck-on" in appearance.  · Sometimes single, but there are usually many growths.  Any growth that bleeds, itches on a regular basis, becomes inflamed, or becomes irritated needs to be evaluated by a skin specialist (dermatologist).  DIAGNOSIS  Diagnosis is mainly based on the way the growths appear. In some cases, it can be difficult to tell this type of skin growth from skin cancer. A skin growth tissue sample (biopsy)  may be used to confirm the diagnosis.  TREATMENT  Most often, treatment is not needed because the skin growths are benign. If the skin growth is irritated easily by clothing or jewelry, causing it to scab or bleed, treatment may be recommended. Patients may also choose to have the growths removed because they do not like their appearance. Most commonly, these growths are treated with cryosurgery.  In cryosurgery, liquid nitrogen is applied to "freeze" the  growth. The growth usually falls off within a matter of days. A blister may form and dry into a scab that will also fall off. After the growth or scab falls off, it may leave a dark or light spot on the skin. This color may fade over time, or it may remain permanent on the skin.  HOME CARE INSTRUCTIONS  If the skin growths are treated with cryosurgery, the treated area needs to be kept clean with water and soap.  SEEK MEDICAL CARE IF:  · You have questions about these growths or other skin problems.  · You develop new symptoms, including:  · A change in the appearance of the skin growth.  · New growths.  · Any bleeding, itching, or pain in the growths.  · A skin growth that looks similar to seborrheic keratosis.  Document Released: 04/29/2010 Document Revised: 06/19/2011 Document Reviewed: 04/29/2010  [employer]® Patient Information ©2013 [employer].

## 2012-07-05 NOTE — Progress Notes (Signed)
  Subjective:    Patient ID: Julie Savage, female    DOB: September 19, 1950, 62 y.o.   MRN: [id number]  HPI Patient seen with pruritic skin lesions right upper back. No history of skin cancer personally nor any family history. Possible growth of dark-colored lesion right upper back over several months. No bleeding.  Past Medical History  Diagnosis Date  . HYPERTENSION 10/27/2008  . AMNESIA, TRANSIENT GLOBAL 11/24/2008  . ASTHMA 10/27/2008  . OSTEOPOROSIS 10/27/2008   No past surgical history on file.  reports that she has never smoked. She does not have any smokeless tobacco history on file. Her alcohol and drug histories are not on file. family history includes Cancer in her sister; Diabetes in her brother; and Heart disease (age of onset: 23) in her mother. Allergies  Allergen Reactions  . Aspirin   . Penicillins       Review of Systems  Constitutional: Negative for appetite change and unexpected weight change.  Hematological: Negative for adenopathy.       Objective:   Physical Exam  Constitutional: She appears well-developed and well-nourished.  Cardiovascular: Normal rate and regular rhythm.   Skin:  Patient has well demarcated 3-4 mm scaly homogenous colored lesion right upper back consistent with seborrheic keratosis. She has somewhat larger 6-7 mm light brown well-demarcated symmetric area just inferior and lateral to this          Assessment & Plan:  Inflamed seborrheic keratoses. Discussed risk and benefits of treatment with liquid nitrogen and patient consents. 2 lesions treated upper back ithout difficulty. Followup in 2 weeks if not resolving

## 2012-08-30 ENCOUNTER — Encounter: Payer: Self-pay | Admitting: Family Medicine

## 2012-08-30 ENCOUNTER — Ambulatory Visit (INDEPENDENT_AMBULATORY_CARE_PROVIDER_SITE_OTHER): Payer: BC Managed Care – PPO | Admitting: Family Medicine

## 2012-08-30 VITALS — BP 168/110 | Temp 98.7°F | Wt 118.0 lb

## 2012-08-30 DIAGNOSIS — W57XXXA Bitten or stung by nonvenomous insect and other nonvenomous arthropods, initial encounter: Secondary | ICD-10-CM

## 2012-08-30 DIAGNOSIS — S30860A Insect bite (nonvenomous) of lower back and pelvis, initial encounter: Secondary | ICD-10-CM

## 2012-08-30 NOTE — Progress Notes (Signed)
  Subjective:    Patient ID: Julie Savage, female    DOB: [birth year], 62 y.o.   MRN: [id number]  HPI Patient seen with concern for pruritic "mole" left upper back. She first noted couple weeks ago. Denies any fever, generalized skin rash, or headache.  Hypertension with whitecoat syndrome. Blood pressure is very well controlled by home readings. She brings her cuff in today to compare with ours.  Past Medical History  Diagnosis Date  . HYPERTENSION 10/27/2008  . AMNESIA, TRANSIENT GLOBAL 11/24/2008  . ASTHMA 10/27/2008  . OSTEOPOROSIS 10/27/2008   No past surgical history on file.  reports that she has never smoked. She does not have any smokeless tobacco history on file. Her alcohol and drug histories are not on file. family history includes Cancer in her sister; Diabetes in her brother; and Heart disease (age of onset: 75) in her mother. Allergies  Allergen Reactions  . Aspirin   . Penicillins       Review of Systems  Constitutional: Negative for fever and chills.  Neurological: Negative for headaches.       Objective:   Physical Exam  Constitutional: She appears well-developed and well-nourished.  Cardiovascular: Normal rate and regular rhythm.   Pulmonary/Chest: Effort normal and breath sounds normal. No respiratory distress. She has no wheezes. She has no rales.  Skin:  Patient has a tick bitten in left upper back region with some mild surrounding erythema.          Assessment & Plan:  Tick bite left upper back. We used a flat teeth pickups to remove and portion of the mouth was left in the skin. Anesthesia with 1% plain Xylocaine. Prepped with Betadine. Using pickups and needle able to dislodge the retained tick part. Antibiotic and dressing applied. Reviewed signs and symptoms of [facility] spotted fever. Followup promptly for any changes.  She has NO symptoms at this time.

## 2012-08-30 NOTE — Patient Instructions (Addendum)
[place] Spotted Fever [place] Sp[place] Fever (RMSF) is the oldest known tick-borne disease of people in the [place]. This disease was named because it was first described among people in the [place] area who had an illness characterized by a rash with red-purple-black spots. This disease is caused by a rickettsia (Rickettsia rickettsii), a bacteria carried by the tick. The [place] wood tick and the American dog tick, acquire and transmit the RMSF bacteria (pictures NOT actual size). When a larval, nymphal or adult tick feeds on an infected rodent or larger animal, the tick can become infected. Infected adult ticks then feed on people who may then get RMSF. The tick transmits the disease to humans during a prolonged period of feeding that lasts many hours, days or even a couple weeks. The bite is painless and frequently goes unnoticed. An infected female tick may also pass the rickettsial bacteria to her eggs that then may mature to be infected adult ticks. The rickettsia that causes RMSF can also get into a person's body through damaged skin. A tick bite is not necessary. People can get RMSF if they crush a tick and get it's blood or body fluids on their skin through a small cut or sore.  DIAGNOSIS Diagnosis is made by laboratory tests.  TREATMENT Treatment is with antibiotics (medications that kill rickettsia and other bacteria). Immediate treatment usually prevents death. GEOGRAPHIC RANGE This disease was reported only in the [place] until 1931. RMSF has more recently been described among individuals in all states except [place], [place] and [place]. The highest reported incidences of RMSF now occur among residents of [place], [place], [place] and the [place]. TIME OF YEAR  Most cases are diagnosed during late spring and summer when ticks are most active. However, especially in the warmer southern states, a few cases occur during the winter. SYMPTOMS    Symptoms of RMSF begin from 2 to 14 days after a tick bite. The most common early symptoms are fever, muscle aches and headache followed by nausea (feeling sick to your stomach) or vomiting.  The RMSF rash is typically delayed until 3 or more days after symptom onset, and eventually develops in 9 of 10 infected patients by the 5th day of illness. If the disease is not treated it can cause death. If you get a fever, headache, muscle aches, rash, nausea or vomiting within 2 weeks of a possible tick bite or exposure you should see your caregiver immediately. PREVENTION Ticks prefer to hide in shady, moist ground litter. They can often be found above the ground clinging to tall grass, brush, shrubs and low tree branches. They also inhabit lawns and gardens, especially at the edges of woodlands and around old stone walls. Within the areas where ticks generally live, no naturally vegetated area can be considered completely free of infected ticks. The best precaution against RMSF is to avoid contact with soil, leaf litter and vegetation as much as possible in tick infested areas. For those who enjoy gardening or walking in their yards, clear brush and mow tall grass around houses and at the edges of gardens. This may help reduce the tick population in the immediate area. Applications of chemical insecticides by a licensed professional in the spring (late May) and Fall (September) will also control ticks, especially in heavily infested areas. Treatment will never get rid of all the ticks. Getting rid of small animal populations that host ticks will also decrease the tick population. When working in the garden,   pruning shrubs, or handling soil and vegetation, wear light-colored protective clothing and gloves. Spot-check often to prevent ticks from reaching the skin. Ticks cannot jump or fly. They will not drop from an above-ground perch onto a passing animal. Once a tick gains access to human skin it climbs upward  until it reaches a more protected area. For example, the back of the knee, groin, navel, armpit, ears or nape of the neck. It then begins the slow process of embedding itself in the skin. Campers, hikers, field workers, and others who spend time in wooded, brushy or tall grassy areas can avoid exposure to ticks by using the following precautions:  Wear light-colored clothing with a tight weave to spot ticks more easily and prevent contact with the skin.  Wear long pants tucked into socks, long-sleeved shirts tucked into pants and enclosed shoes or boots along with insect repellent.  Spray clothes with insect repellent containing either DEET or Permethrin. Only DEET can be used on exposed skin. Follow the manufacturer's directions carefully.  Wear a hat and keep long hair pulled back.  Stay on cleared, well-worn trails whenever possible.  Spot-check yourself and others often for the presence of ticks on clothes. If you find one, there are likely to be others. Check thoroughly.  Remove clothes after leaving tick-infested areas. If possible, wash them to eliminate any unseen ticks. Check yourself, your children and any pets from head to toe for the presence of ticks.  Shower and shampoo. You can greatly reduce your chances of contracting RMSF if you remove attached ticks as soon as possible. Regular checks of the body, including all body sites covered by hair (head, armpits, genitals), allow removal of the tick before rickettsial transmission. To remove an attached tick, use a forceps or tweezers to detach the intact tick without leaving mouth parts in the skin. The tick bite wound should be cleansed after tick removal. Remember the most common symptoms of RMSF are fever, muscle aches, headache and nausea or vomiting with a later onset of rash. If you get these symptoms after a tick bite and while living in an area where RMSF is found, RMSF should be suspected. If the disease is not treated, it can  cause death. See your caregiver immediately if you get these symptoms. Do this even if not aware of a tick bite. Document Released: 07/09/2000 Document Revised: 06/19/2011 Document Reviewed: 03/01/2009 [employer] Patient Information 2014 [employer].  

## 2012-11-25 ENCOUNTER — Encounter: Payer: Self-pay | Admitting: Family Medicine

## 2012-11-25 ENCOUNTER — Other Ambulatory Visit: Payer: Self-pay

## 2012-11-25 ENCOUNTER — Ambulatory Visit (INDEPENDENT_AMBULATORY_CARE_PROVIDER_SITE_OTHER): Payer: BC Managed Care – PPO | Admitting: Family Medicine

## 2012-11-25 VITALS — BP 132/88 | HR 72 | Temp 98.5°F | Wt 118.0 lb

## 2012-11-25 DIAGNOSIS — M545 Low back pain, unspecified: Secondary | ICD-10-CM

## 2012-11-25 DIAGNOSIS — M722 Plantar fascial fibromatosis: Secondary | ICD-10-CM

## 2012-11-25 MED ORDER — ALENDRONATE SODIUM 70 MG PO TABS: 70.0000 mg | ORAL_TABLET | ORAL | Status: DC

## 2012-11-25 NOTE — Patient Instructions (Addendum)
Lumbosacral Strain Lumbosacral strain is one of the most common causes of back pain. There are many causes of back pain. Most are not serious conditions. CAUSES  Your backbone (spinal column) is made up of 24 main vertebral bodies, the sacrum, and the coccyx. These are held together by muscles and tough, fibrous tissue (ligaments). Nerve roots pass through the openings between the vertebrae. A sudden move or injury to the back may cause injury to, or pressure on, these nerves. This may result in localized back pain or pain movement (radiation) into the buttocks, down the leg, and into the foot. Sharp, shooting pain from the buttock down the back of the leg (sciatica) is frequently associated with a ruptured (herniated) disk. Pain may be caused by muscle spasm alone. Your caregiver can often find the cause of your pain by the details of your symptoms and an exam. In some cases, you may need tests (such as X-rays). Your caregiver will work with you to decide if any tests are needed based on your specific exam. HOME CARE INSTRUCTIONS   Avoid an underactive lifestyle. Active exercise, as directed by your caregiver, is your greatest weapon against back pain.  Avoid hard physical activities (tennis, racquetball, waterskiing) if you are not in proper physical condition for it. This may aggravate or create problems.  If you have a back problem, avoid sports requiring sudden body movements. Swimming and walking are generally safer activities.  Maintain good posture.  Avoid becoming overweight (obese).  Use bed rest for only the most extreme, sudden (acute) episode. Your caregiver will help you determine how much bed rest is necessary.  For acute conditions, you may put ice on the injured area.  Put ice in a plastic bag.  Place a towel between your skin and the bag.  Leave the ice on for 15-20 minutes at a time, every 2 hours, or as needed.  After you are improved and more active, it may help to  apply heat for 30 minutes before activities. See your caregiver if you are having pain that lasts longer than expected. Your caregiver can advise appropriate exercises or therapy if needed. With conditioning, most back problems can be avoided. SEEK IMMEDIATE MEDICAL CARE IF:   You have numbness, tingling, weakness, or problems with the use of your arms or legs.  You experience severe back pain not relieved with medicines.  There is a change in bowel or bladder control.  You have increasing pain in any area of the body, including your belly (abdomen).  You notice shortness of breath, dizziness, or feel faint.  You feel sick to your stomach (nauseous), are throwing up (vomiting), or become sweaty.  You notice discoloration of your toes or legs, or your feet get very cold.  Your back pain is getting worse.  You have a fever. MAKE SURE YOU:   Understand these instructions.  Will watch your condition.  Will get help right away if you are not doing well or get worse. Document Released: 01/04/2005 Document Revised: 06/19/2011 Document Reviewed: 06/26/2008 [facility] Patient Information 2014 [place], [place]. Plantar Fasciitis (Heel Spur Syndrome) with Rehab The plantar fascia is a fibrous, ligament-like, soft-tissue structure that spans the bottom of the foot. Plantar fasciitis is a condition that causes pain in the foot due to inflammation of the tissue. SYMPTOMS   Pain and tenderness on the underneath side of the foot.  Pain that worsens with standing or walking. CAUSES  Plantar fasciitis is caused by irritation and injury to  the plantar fascia on the underneath side of the foot. Common mechanisms of injury include:  Direct trauma to bottom of the foot.  Damage to a small nerve that runs under the foot where the main fascia attaches to the heel bone.  Stress placed on the plantar fascia due to bone spurs. RISK INCREASES WITH:   Activities that place stress on the plantar  fascia (running, jumping, pivoting, or cutting).  Poor strength and flexibility.  Improperly fitted shoes.  Tight calf muscles.  Flat feet.  Failure to warm-up properly before activity.  Obesity. PREVENTION  Warm up and stretch properly before activity.  Allow for adequate recovery between workouts.  Maintain physical fitness:  Strength, flexibility, and endurance.  Cardiovascular fitness.  Maintain a health body weight.  Avoid stress on the plantar fascia.  Wear properly fitted shoes, including arch supports for individuals who have flat feet. PROGNOSIS  If treated properly, then the symptoms of plantar fasciitis usually resolve without surgery. However, occasionally surgery is necessary. RELATED COMPLICATIONS   Recurrent symptoms that may result in a chronic condition.  Problems of the lower back that are caused by compensating for the injury, such as limping.  Pain or weakness of the foot during push-off following surgery.  Chronic inflammation, scarring, and partial or complete fascia tear, occurring more often from repeated injections. TREATMENT  Treatment initially involves the use of ice and medication to help reduce pain and inflammation. The use of strengthening and stretching exercises may help reduce pain with activity, especially stretches of the Achilles tendon. These exercises may be performed at home or with a therapist. Your caregiver may recommend that you use heel cups of arch supports to help reduce stress on the plantar fascia. Occasionally, corticosteroid injections are given to reduce inflammation. If symptoms persist for greater than 6 months despite non-surgical (conservative), then surgery may be recommended.  MEDICATION   If pain medication is necessary, then nonsteroidal anti-inflammatory medications, such as aspirin and ibuprofen, or other minor pain relievers, such as acetaminophen, are often recommended.  Do not take pain medication within  7 days before surgery.  Prescription pain relievers may be given if deemed necessary by your caregiver. Use only as directed and only as much as you need.  Corticosteroid injections may be given by your caregiver. These injections should be reserved for the most serious cases, because they may only be given a certain number of times. HEAT AND COLD  Cold treatment (icing) relieves pain and reduces inflammation. Cold treatment should be applied for 10 to 15 minutes every 2 to 3 hours for inflammation and pain and immediately after any activity that aggravates your symptoms. Use ice packs or massage the area with a piece of ice (ice massage).  Heat treatment may be used prior to performing the stretching and strengthening activities prescribed by your caregiver, physical therapist, or athletic trainer. Use a heat pack or soak the injury in warm water. SEEK IMMEDIATE MEDICAL CARE IF:  Treatment seems to offer no benefit, or the condition worsens.  Any medications produce adverse side effects. EXERCISES RANGE OF MOTION (ROM) AND STRETCHING EXERCISES - Plantar Fasciitis (Heel Spur Syndrome) These exercises may help you when beginning to rehabilitate your injury. Your symptoms may resolve with or without further involvement from your physician, physical therapist or athletic trainer. While completing these exercises, remember:   Restoring tissue flexibility helps normal motion to return to the joints. This allows healthier, less painful movement and activity.  An effective stretch should  be held for at least 30 seconds.  A stretch should never be painful. You should only feel a gentle lengthening or release in the stretched tissue. RANGE OF MOTION - Toe Extension, Flexion  Sit with your right / left leg crossed over your opposite knee.  Grasp your toes and gently pull them back toward the top of your foot. You should feel a stretch on the bottom of your toes and/or foot.  Hold this stretch for  __________ seconds.  Now, gently pull your toes toward the bottom of your foot. You should feel a stretch on the top of your toes and or foot.  Hold this stretch for __________ seconds. Repeat __________ times. Complete this stretch __________ times per day.  RANGE OF MOTION - Ankle Dorsiflexion, Active Assisted  Remove shoes and sit on a chair that is preferably not on a carpeted surface.  Place right / left foot under knee. Extend your opposite leg for support.  Keeping your heel down, slide your right / left foot back toward the chair until you feel a stretch at your ankle or calf. If you do not feel a stretch, slide your bottom forward to the edge of the chair, while still keeping your heel down.  Hold this stretch for __________ seconds. Repeat __________ times. Complete this stretch __________ times per day.  STRETCH  Gastroc, Standing  Place hands on wall.  Extend right / left leg, keeping the front knee somewhat bent.  Slightly point your toes inward on your back foot.  Keeping your right / left heel on the floor and your knee straight, shift your weight toward the wall, not allowing your back to arch.  You should feel a gentle stretch in the right / left calf. Hold this position for __________ seconds. Repeat __________ times. Complete this stretch __________ times per day. STRETCH  Soleus, Standing  Place hands on wall.  Extend right / left leg, keeping the other knee somewhat bent.  Slightly point your toes inward on your back foot.  Keep your right / left heel on the floor, bend your back knee, and slightly shift your weight over the back leg so that you feel a gentle stretch deep in your back calf.  Hold this position for __________ seconds. Repeat __________ times. Complete this stretch __________ times per day. STRETCH  Gastrocsoleus, Standing  Note: This exercise can place a lot of stress on your foot and ankle. Please complete this exercise only if  specifically instructed by your caregiver.   Place the ball of your right / left foot on a step, keeping your other foot firmly on the same step.  Hold on to the wall or a rail for balance.  Slowly lift your other foot, allowing your body weight to press your heel down over the edge of the step.  You should feel a stretch in your right / left calf.  Hold this position for __________ seconds.  Repeat this exercise with a slight bend in your right / left knee. Repeat __________ times. Complete this stretch __________ times per day.  STRENGTHENING EXERCISES - Plantar Fasciitis (Heel Spur Syndrome)  These exercises may help you when beginning to rehabilitate your injury. They may resolve your symptoms with or without further involvement from your physician, physical therapist or athletic trainer. While completing these exercises, remember:   Muscles can gain both the endurance and the strength needed for everyday activities through controlled exercises.  Complete these exercises as instructed by your physician,  physical therapist or athletic trainer. Progress the resistance and repetitions only as guided. STRENGTH - Towel Curls  Sit in a chair positioned on a non-carpeted surface.  Place your foot on a towel, keeping your heel on the floor.  Pull the towel toward your heel by only curling your toes. Keep your heel on the floor.  If instructed by your physician, physical therapist or athletic trainer, add ____________________ at the end of the towel. Repeat __________ times. Complete this exercise __________ times per day. STRENGTH - Ankle Inversion  Secure one end of a rubber exercise band/tubing to a fixed object (table, pole). Loop the other end around your foot just before your toes.  Place your fists between your knees. This will focus your strengthening at your ankle.  Slowly, pull your big toe up and in, making sure the band/tubing is positioned to resist the entire  motion.  Hold this position for __________ seconds.  Have your muscles resist the band/tubing as it slowly pulls your foot back to the starting position. Repeat __________ times. Complete this exercises __________ times per day.  Document Released: 03/27/2005 Document Revised: 06/19/2011 Document Reviewed: 07/09/2008 [facility] Patient Information 2014 [place], [place].

## 2012-11-25 NOTE — Progress Notes (Signed)
  Subjective:    Patient ID: Julie Savage, female    DOB: March 28, 1951, 62 y.o.   MRN: [id number]  HPI Patient seen with left lumbar back pain. Onset about 10 days ago. No injury. Burning type quality. Occasionally radiates left anterior thigh but not consistently. Pain is mild to moderate. Worse with changing positions. Sometimes feels stiff. No lower extremity numbness or weakness. No chronic back difficulties. Heat and ice with minimal relief. She has not tried any medications.  Left heel pain. Onset several weeks ago. Does lots of walking. Location is plantar fascia proximal attachment near calcaneus. No swelling. No injury. She has not been doing any running. She's tried heel cups with minimal improvement.   Review of Systems  Constitutional: Negative for fever, chills, appetite change and unexpected weight change.  Musculoskeletal: Positive for back pain.  Neurological: Negative for weakness and numbness.       Objective:   Physical Exam  Constitutional: She appears well-developed and well-nourished.  Cardiovascular: Normal rate and regular rhythm.   Pulmonary/Chest: Effort normal and breath sounds normal. No respiratory distress. She has no wheezes. She has no rales.  Musculoskeletal: She exhibits no edema.  Back reveals no specific point tenderness. Straight leg raise is negative. Left heel reveals no visible swelling. No erythema. Minimally tender over calcaneus near attachment of plantar fascia. No Achilles tenderness. Full range of motion foot and ankle          Assessment & Plan:  #1 acute lumbar back pain. Continue heat or ice. Stretches given. Work on [employer]. #2 plantar fasciitis. We've recommended stretches. Icing after activities. Heel cups. She's not interested in steroid injection this time

## 2013-02-04 ENCOUNTER — Encounter: Payer: Self-pay | Admitting: Internal Medicine

## 2013-02-04 ENCOUNTER — Ambulatory Visit (INDEPENDENT_AMBULATORY_CARE_PROVIDER_SITE_OTHER): Payer: BC Managed Care – PPO | Admitting: Internal Medicine

## 2013-02-04 VITALS — BP 170/100 | HR 77 | Temp 98.0°F | Wt 117.0 lb

## 2013-02-04 DIAGNOSIS — Z23 Encounter for immunization: Secondary | ICD-10-CM

## 2013-02-04 DIAGNOSIS — R030 Elevated blood-pressure reading, without diagnosis of hypertension: Secondary | ICD-10-CM

## 2013-02-04 DIAGNOSIS — L02429 Furuncle of limb, unspecified: Secondary | ICD-10-CM

## 2013-02-04 MED ORDER — DOXYCYCLINE HYCLATE 100 MG PO CAPS: 100.0000 mg | ORAL_CAPSULE | Freq: Two times a day (BID) | ORAL | Status: DC

## 2013-02-04 NOTE — Patient Instructions (Signed)
This seems like a  Small boil and not related to the breast or lymph glands.     Use hot compresses 4 x per day at least to help resolve this . If needed we can add antibiotic but usually not necessary.   Ok for flu  vaccine .

## 2013-02-04 NOTE — Progress Notes (Signed)
Chief Complaint  Patient presents with  . Mass    Under left armpit.  First noticed last week.     HPI: Patient comes in today for SDA for  new problem evaluation. PCP NA Felt lump area left axilla last week  and then hurt after that .  ocass other bumps in groin area that she pops and then they get better. No fever staph iffections breast sx    No one with rash ?flu vaccine today? ROS: See pertinent positives and negatives per HPI.  Past Medical History  Diagnosis Date  . HYPERTENSION 10/27/2008  . AMNESIA, TRANSIENT GLOBAL 11/24/2008  . ASTHMA 10/27/2008  . OSTEOPOROSIS 10/27/2008    Family History  Problem Relation Age of Onset  . Heart disease Mother 21    CVA  . Cancer Sister     breast  . Diabetes Brother     type ll    History   Social History  . Marital Status: Married    Spouse Name: N/A    Number of Children: N/A  . Years of Education: N/A   Social History Main Topics  . Smoking status: Never Smoker   . Smokeless tobacco: None  . Alcohol Use: None  . Drug Use: None  . Sexual Activity: None   Other Topics Concern  . None   Social History Narrative  . None    Outpatient Encounter Prescriptions as of 02/04/2013  Medication Sig Dispense Refill  . alendronate (FOSAMAX) 70 MG tablet Take 1 tablet (70 mg total) by mouth every 7 (seven) days. Take with a full glass of water on an empty stomach.  12 tablet  3  . amLODipine (NORVASC) 2.5 MG tablet Take 1 tablet (2.5 mg total) by mouth daily.  90 tablet  3  . Calcium Carbonate-Vitamin D (CALCIUM-VITAMIN D) 500-200 MG-UNIT per tablet Take 1 tablet by mouth 2 (two) times daily with a meal.        . fish oil-omega-3 fatty acids 1000 MG capsule Take 2 g by mouth. One tab every other day      . vitamin E 100 UNIT capsule Take 100 Units by mouth daily.        Marland Kitchen doxycycline (VIBRAMYCIN) 100 MG capsule Take 1 capsule (100 mg total) by mouth 2 (two) times daily.  14 capsule  0  . [DISCONTINUED] cyclobenzaprine  (FLEXERIL) 5 MG tablet Take 5 mg by mouth at bedtime as needed.        No facility-administered encounter medications on file as of 02/04/2013.    EXAM:  BP 170/100  Pulse 77  Temp(Src) 98 F (36.7 C) (Oral)  Wt 117 lb (53.071 kg)  BMI 20.25 kg/m2  SpO2 98%  Body mass index is 20.25 kg/(m^2).  GENERAL: vitals reviewed and listed above, alert, oriented, appears well hydrated and in no acute distress HEENT: atraumatic, conjunctiva  clear, no obvious abnormalities on inspection of external nose and ears  NECK: no obvious masses on inspection palpation  Left axilla no adenopathy there is a small 8 mm cm superficial pink nodule non fluctuand minimall tender in inferior axillary area  No breast nodule UOQ noted  Left axilla clear  MS: moves all extremities without noticeable focal  abnormality PSYCH: pleasant and cooperative, no obvious depression or anxiety  ASSESSMENT AND PLAN:  Discussed the following assessment and plan:  Boil, axilla - vs cyst should self resolve  withlocal care can add antibiotic if needed   Need for prophylactic vaccination and  inoculation against influenza - Plan: Flu Vaccine QUAD 36+ mos PF IM (Fluarix)  Elevated blood pressure reading - recheck at home fu if elevated  Says BP is up in doctors office   Have her check  And bring in readings to dr B for cpx   Contact us in meantime if continued up.  -Patient advised to return or notify health care team  if symptoms worsen or persist or new concerns arise.  Patient Instructions  This seems like a  Small boil and not related to the breast or lymph glands.     Use hot compresses 4 x per day at least to help resolve this . If needed we can add antibiotic but usually not necessary.   Ok for flu  vaccine .    Neta Mends. Humbert Morozov M.D.

## 2013-02-05 DIAGNOSIS — R030 Elevated blood-pressure reading, without diagnosis of hypertension: Secondary | ICD-10-CM | POA: Insufficient documentation

## 2013-03-05 ENCOUNTER — Other Ambulatory Visit (INDEPENDENT_AMBULATORY_CARE_PROVIDER_SITE_OTHER): Payer: BC Managed Care – PPO

## 2013-03-05 DIAGNOSIS — Z0000 Encounter for general adult medical examination without abnormal findings: Secondary | ICD-10-CM

## 2013-03-05 LAB — LIPID PANEL
Cholesterol: 184 mg/dL (ref 0–200)
HDL: 73 mg/dL (ref >39.00)
LDL Cholesterol: 106 mg/dL — ABNORMAL HIGH (ref 0–99)
Triglycerides: 25 mg/dL (ref 0.0–149.0)

## 2013-03-05 LAB — BASIC METABOLIC PANEL
CO2: 31 mEq/L (ref 19–32)
Chloride: 104 mEq/L (ref 96–112)
GFR: 116.51 mL/min (ref >60.00)
Glucose, Bld: 88 mg/dL (ref 70–99)
Potassium: 4.3 mEq/L (ref 3.5–5.1)
Sodium: 139 mEq/L (ref 135–145)

## 2013-03-05 LAB — CBC WITH DIFFERENTIAL/PLATELET
Basophils Absolute: 0 10*3/uL (ref 0.0–0.1)
HCT: 35.8 % — ABNORMAL LOW (ref 36.0–46.0)
Hemoglobin: 12 g/dL (ref 12.0–15.0)
Lymphs Abs: 1.4 10*3/uL (ref 0.7–4.0)
MCHC: 33.5 g/dL (ref 30.0–36.0)
MCV: 91.1 fl (ref 78.0–100.0)
Monocytes Absolute: 0.2 10*3/uL (ref 0.1–1.0)
Monocytes Relative: 4.8 % (ref 3.0–12.0)
Neutro Abs: 3.4 10*3/uL (ref 1.4–7.7)
RDW: 13.3 % (ref 11.5–14.6)

## 2013-03-05 LAB — POCT URINALYSIS DIPSTICK
Bilirubin, UA: NEGATIVE
Blood, UA: NEGATIVE
Glucose, UA: NEGATIVE
Nitrite, UA: NEGATIVE
Spec Grav, UA: 1.015
Urobilinogen, UA: 0.2

## 2013-03-05 LAB — TSH: TSH: 1.11 u[IU]/mL (ref 0.35–5.50)

## 2013-03-05 LAB — HEPATIC FUNCTION PANEL
AST: 19 U/L (ref 0–37)
Albumin: 3.9 g/dL (ref 3.5–5.2)

## 2013-03-12 ENCOUNTER — Encounter: Payer: Self-pay | Admitting: Family Medicine

## 2013-03-12 ENCOUNTER — Ambulatory Visit (INDEPENDENT_AMBULATORY_CARE_PROVIDER_SITE_OTHER): Payer: BC Managed Care – PPO | Admitting: Family Medicine

## 2013-03-12 VITALS — BP 138/70 | HR 79 | Temp 97.7°F | Ht 63.0 in | Wt 119.0 lb

## 2013-03-12 DIAGNOSIS — Z0000 Encounter for general adult medical examination without abnormal findings: Secondary | ICD-10-CM

## 2013-03-12 MED ORDER — AMLODIPINE BESYLATE 5 MG PO TABS: 5.0000 mg | ORAL_TABLET | Freq: Every day | ORAL | Status: DC

## 2013-03-12 NOTE — Progress Notes (Signed)
   Subjective:    Patient ID: Julie Savage, female    DOB: [birth year], 62 y.o.   MRN: [id number]  HPI Patient seen for complete physical. She continues to see gynecologist regularly. She has history of hypertension and likely whitecoat syndrome. Her blood pressures are consistently well controlled at home and consistently high here. She is brought her cuff and check with ours at least 2 times previously without consistent readings. She currently takes amlodipine 2.5 mg once daily. We previously tried 5 mg that she had increased weakness. She's not describing any recent orthostasis. She walks with exercise. No chest pains. No recent headaches.  She's had previous colonoscopy but she thinks this may have been 10 years ago. We have no record. She'll try to confirm. No history of smoking. Remote history of transient global amnesia. Immunizations are all up to date  Past Medical History  Diagnosis Date  . HYPERTENSION 10/27/2008  . AMNESIA, TRANSIENT GLOBAL 11/24/2008  . ASTHMA 10/27/2008  . OSTEOPOROSIS 10/27/2008   No past surgical history on file.  reports that she has never smoked. She does not have any smokeless tobacco history on file. Her alcohol and drug histories are not on file. family history includes Cancer in her sister; Diabetes in her brother; Heart disease (age of onset: 65) in her mother. Allergies  Allergen Reactions  . Aspirin   . Penicillins       Review of Systems  Constitutional: Negative for fever, activity change, appetite change, fatigue and unexpected weight change.  HENT: Negative for ear pain, hearing loss, sore throat and trouble swallowing.   Eyes: Negative for visual disturbance.  Respiratory: Negative for cough and shortness of breath.   Cardiovascular: Negative for chest pain and palpitations.  Gastrointestinal: Negative for abdominal pain, diarrhea, constipation and blood in stool.  Genitourinary: Negative for dysuria and hematuria.  Musculoskeletal:  Negative for arthralgias, back pain and myalgias.  Skin: Negative for rash.  Neurological: Negative for dizziness, syncope and headaches.  Hematological: Negative for adenopathy.  Psychiatric/Behavioral: Negative for confusion and dysphoric mood.       Objective:   Physical Exam  Constitutional: She is oriented to person, place, and time. She appears well-developed and well-nourished.  HENT:  Head: Normocephalic and atraumatic.  Eyes: EOM are normal. Pupils are equal, round, and reactive to light.  Neck: Normal range of motion. Neck supple. No thyromegaly present.  Cardiovascular: Normal rate, regular rhythm and normal heart sounds.   No murmur heard. Pulmonary/Chest: Breath sounds normal. No respiratory distress. She has no wheezes. She has no rales.  Abdominal: Soft. Bowel sounds are normal. She exhibits no distension and no mass. There is no tenderness. There is no rebound and no guarding.  Genitourinary:  Per GYN  Musculoskeletal: Normal range of motion. She exhibits no edema.  Lymphadenopathy:    She has no cervical adenopathy.  Neurological: She is alert and oriented to person, place, and time. She displays normal reflexes. No cranial nerve deficit.  Skin: No rash noted.  Psychiatric: She has a normal mood and affect. Her behavior is normal. Judgment and thought content normal.          Assessment & Plan:  Complete physical. Blood pressure repeat left arm seated 170/84. Probable white coat syndrome. Try titration of amlodipine to 5 mg once daily. Return in one month. Bring her home blood pressure cuff then. Confirm date of last colonoscopy. Labs reviewed with patient and all favorable. Continue GYN followup.

## 2013-03-12 NOTE — Progress Notes (Signed)
Pre visit review using our clinic review tool, if applicable. No additional management support is needed unless otherwise documented below in the visit note. 

## 2013-03-12 NOTE — Patient Instructions (Signed)

## 2013-04-09 ENCOUNTER — Other Ambulatory Visit: Payer: Self-pay

## 2013-04-09 MED ORDER — AMLODIPINE BESYLATE 5 MG PO TABS: 5.0000 mg | ORAL_TABLET | Freq: Every day | ORAL | Status: DC

## 2013-04-14 ENCOUNTER — Ambulatory Visit: Payer: BC Managed Care – PPO | Admitting: Family Medicine

## 2013-04-16 ENCOUNTER — Encounter: Payer: Self-pay | Admitting: Family Medicine

## 2013-04-16 ENCOUNTER — Ambulatory Visit (INDEPENDENT_AMBULATORY_CARE_PROVIDER_SITE_OTHER): Payer: BC Managed Care – PPO | Admitting: Family Medicine

## 2013-04-16 VITALS — BP 150/82 | HR 97 | Temp 97.9°F | Wt 119.0 lb

## 2013-04-16 DIAGNOSIS — I10 Essential (primary) hypertension: Secondary | ICD-10-CM

## 2013-04-16 NOTE — Progress Notes (Signed)
Pre visit review using our clinic review tool, if applicable. No additional management support is needed unless otherwise documented below in the visit note. 

## 2013-04-16 NOTE — Patient Instructions (Signed)

## 2013-04-16 NOTE — Progress Notes (Signed)
   Subjective:    Patient ID: Julie Savage, female    DOB: Feb 20, 1951, 63 y.o.   MRN: [id number]  HPI  Followup hypertension. Suspected whitecoat syndrome. Several recent elevated readings. We increased her amlodipine to 5 mg. She's had consistent good control by home readings. No dizziness. No headaches. No peripheral edema issues. Blood pressure log reviewed and she is consistently been below 120/80 by home readings. She exercises regularly. Minimal sodium use. No alcohol use. No nonsteroidal use.  Past Medical History  Diagnosis Date  . HYPERTENSION 10/27/2008  . AMNESIA, TRANSIENT GLOBAL 11/24/2008  . ASTHMA 10/27/2008  . OSTEOPOROSIS 10/27/2008   No past surgical history on file.  reports that she has never smoked. She does not have any smokeless tobacco history on file. Her alcohol and drug histories are not on file. family history includes Cancer in her sister; Diabetes in her brother; Heart disease (age of onset: 80) in her mother. Allergies  Allergen Reactions  . Aspirin   . Penicillins      Review of Systems  Constitutional: Negative for fatigue.  Eyes: Negative for visual disturbance.  Respiratory: Negative for cough, chest tightness, shortness of breath and wheezing.   Cardiovascular: Negative for chest pain, palpitations and leg swelling.  Neurological: Negative for dizziness, seizures, syncope, weakness, light-headedness and headaches.       Objective:   Physical Exam  Constitutional: She appears well-developed and well-nourished.  Cardiovascular: Normal rate.   Pulmonary/Chest: Effort normal and breath sounds normal. No respiratory distress. She has no wheezes. She has no rales.  Musculoskeletal: She exhibits no edema.          Assessment & Plan:  Hypertension. Recheck with her machine and ours and obtained around 160/90 with both. Suspect white coat syndrome. Continue home monitoring. She has been very well controlled by home readings.

## 2013-04-17 ENCOUNTER — Telehealth: Payer: Self-pay | Admitting: Family Medicine

## 2013-04-17 NOTE — Telephone Encounter (Signed)
Relevant patient education assigned to patient using Emmi. ° °

## 2013-05-14 ENCOUNTER — Other Ambulatory Visit: Payer: Self-pay

## 2013-05-14 DIAGNOSIS — Z1231 Encounter for screening mammogram for malignant neoplasm of breast: Secondary | ICD-10-CM

## 2013-05-30 ENCOUNTER — Ambulatory Visit: Payer: BC Managed Care – PPO

## 2013-06-06 ENCOUNTER — Ambulatory Visit
Admission: RE | Admit: 2013-06-06 | Discharge: 2013-06-06 | Disposition: A | Discharge status: Home / Self Care | Payer: BC Managed Care – PPO | Source: Ambulatory Visit

## 2013-06-06 ENCOUNTER — Other Ambulatory Visit: Payer: Self-pay

## 2013-06-06 DIAGNOSIS — Z1231 Encounter for screening mammogram for malignant neoplasm of breast: Secondary | ICD-10-CM

## 2013-07-08 ENCOUNTER — Encounter: Payer: Self-pay | Admitting: Internal Medicine

## 2013-07-24 ENCOUNTER — Other Ambulatory Visit: Payer: Self-pay

## 2013-07-24 MED ORDER — AMLODIPINE BESYLATE 2.5 MG PO TABS: 2.5000 mg | ORAL_TABLET | Freq: Every day | ORAL | Status: DC

## 2013-08-22 ENCOUNTER — Telehealth: Payer: Self-pay

## 2013-08-22 NOTE — Telephone Encounter (Signed)
Pt had colon 51yrs ago but doesn't remember details.  Do you want an OV or go ahead with PV on Monday 08/25/13?

## 2013-08-25 ENCOUNTER — Ambulatory Visit (AMBULATORY_SURGERY_CENTER): Payer: Self-pay

## 2013-08-25 ENCOUNTER — Encounter: Payer: Self-pay | Admitting: Internal Medicine

## 2013-08-25 VITALS — Ht 64.5 in | Wt 116.2 lb

## 2013-08-25 DIAGNOSIS — Z1211 Encounter for screening for malignant neoplasm of colon: Secondary | ICD-10-CM

## 2013-08-25 MED ORDER — MOVIPREP 100 G PO SOLR: 1.0000 | Freq: Once | ORAL | Status: DC

## 2013-08-25 NOTE — Telephone Encounter (Signed)
If last colon 10 yrs ago, then would be due for repeat screening at this time

## 2013-08-25 NOTE — Telephone Encounter (Signed)
Pt in for previsit today.  Stated that she remembers last colon was normal.  No polyps.

## 2013-08-25 NOTE — Progress Notes (Signed)
No allergies to eggs or soy No past problems with anesthesia No diet/weight loss meds No home oxygen  Has email  Emmi instructions given for colonoscopy 

## 2013-09-02 ENCOUNTER — Encounter: Payer: Self-pay | Admitting: Internal Medicine

## 2013-09-02 ENCOUNTER — Ambulatory Visit (AMBULATORY_SURGERY_CENTER): Payer: BC Managed Care – PPO | Admitting: Internal Medicine

## 2013-09-02 VITALS — BP 128/76 | HR 60 | Temp 96.7°F | Resp 18 | Ht 64.5 in | Wt 116.0 lb

## 2013-09-02 DIAGNOSIS — Z1211 Encounter for screening for malignant neoplasm of colon: Secondary | ICD-10-CM

## 2013-09-02 DIAGNOSIS — D129 Benign neoplasm of anus and anal canal: Secondary | ICD-10-CM

## 2013-09-02 DIAGNOSIS — D126 Benign neoplasm of colon, unspecified: Secondary | ICD-10-CM

## 2013-09-02 DIAGNOSIS — D128 Benign neoplasm of rectum: Secondary | ICD-10-CM

## 2013-09-02 MED ORDER — SODIUM CHLORIDE 0.9 % IV SOLN: 500.0000 mL | INTRAVENOUS | Status: DC

## 2013-09-02 NOTE — Patient Instructions (Signed)
YOU HAD AN ENDOSCOPIC PROCEDURE TODAY AT THE [facility]: Refer to the procedure report that was given to you for any specific questions about what was found during the examination.  If the procedure report does not answer your questions, please call your gastroenterologist to clarify.  If you requested that your care partner not be given the details of your procedure findings, then the procedure report has been included in a sealed envelope for you to review at your convenience later.  YOU SHOULD EXPECT: Some feelings of bloating in the abdomen. Passage of more gas than usual.  Walking can help get rid of the air that was put into your GI tract during the procedure and reduce the bloating. If you had a lower endoscopy (such as a colonoscopy or flexible sigmoidoscopy) you may notice spotting of blood in your stool or on the toilet paper. If you underwent a bowel prep for your procedure, then you may not have a normal bowel movement for a few days.  DIET: Your first meal following the procedure should be a light meal and then it is ok to progress to your normal diet.  A half-sandwich or bowl of soup is an example of a good first meal.  Heavy or fried foods are harder to digest and may make you feel nauseous or bloated.  Likewise meals heavy in dairy and vegetables can cause extra gas to form and this can also increase the bloating.  Drink plenty of fluids but you should avoid alcoholic beverages for 24 hours.  ACTIVITY: Your care partner should take you home directly after the procedure.  You should plan to take it easy, moving slowly for the rest of the day.  You can resume normal activity the day after the procedure however you should NOT DRIVE or use heavy machinery for 24 hours (because of the sedation medicines used during the test).    SYMPTOMS TO REPORT IMMEDIATELY: A gastroenterologist can be reached at any hour.  During normal business hours, 8:30 AM to 5:00 PM Monday through Friday,  call [phone].  After hours and on weekends, please call the GI answering service at [phone] who will take a message and have the physician on call contact you.   Following lower endoscopy (colonoscopy or flexible sigmoidoscopy):  Excessive amounts of blood in the stool  Significant tenderness or worsening of abdominal pains  Swelling of the abdomen that is new, acute  Fever of 100F or higher  FOLLOW UP: If any biopsies were taken you will be contacted by phone or by letter within the next 1-3 weeks.  Call your gastroenterologist if you have not heard about the biopsies in 3 weeks.  Our staff will call the home number listed on your records the next business day following your procedure to check on you and address any questions or concerns that you may have at that time regarding the information given to you following your procedure. This is a courtesy call and so if there is no answer at the home number and we have not heard from you through the emergency physician on call, we will assume that you have returned to your regular daily activities without incident.  SIGNATURES/CONFIDENTIALITY: You and/or your care partner have signed paperwork which will be entered into your electronic medical record.  These signatures attest to the fact that that the information above on your After Visit Summary has been reviewed and is understood.  Full responsibility of the confidentiality of this   discharge information lies with you and/or your care-partner.  Polyps-handout given  Repeat colonoscopy will be determined by pathology   

## 2013-09-02 NOTE — Op Note (Signed)
[facility]  [address] [place], [address]   COLONOSCOPY PROCEDURE REPORT  PATIENT: Julie, Savage  MR#: [id number] BIRTHDATE: March 07, 1951 , 62  yrs. old GENDER: Female ENDOSCOPIST: Jerene Bears, MD REFERRED FA:OZHYQ Elease Hashimoto, M.D. PROCEDURE DATE:  09/02/2013 PROCEDURE:   Colonoscopy with cold biopsy polypectomy and Colonoscopy with snare polypectomy First Screening Colonoscopy - Avg.  risk and is 50 yrs.  old or older - No.  Prior Negative Screening - Now for repeat screening. 10 or more years since last screening  History of Adenoma - Now for follow-up colonoscopy & has been > or = to 3 yrs.  N/A  Polyps Removed Today? Yes. ASA CLASS:   Class II INDICATIONS:elevated risk screening and Patient's immediate family history of colon cancer. MEDICATIONS: MAC sedation, administered by CRNA and Propofol (Diprivan) 180 mg IV  DESCRIPTION OF PROCEDURE:   After the risks benefits and alternatives of the procedure were thoroughly explained, informed consent was obtained.  A digital rectal exam revealed no rectal mass.   The LB [id number]-[id number]  endoscope was introduced through the anus and advanced to the cecum, which was identified by both the appendix and ileocecal valve. No adverse events experienced. The quality of the prep was good, using MoviPrep  The instrument was then slowly withdrawn as the colon was fully examined.   COLON FINDINGS: Two sessile polyps measuring 5 and 2 mm in size were found in the descending colon and rectosigmoid colon.  Polypectomy was performed with cold forceps and using cold snare.  All resections were complete and all polyp tissue was completely retrieved.   The colon mucosa was otherwise normal.  Retroflexed views revealed internal hemorrhoids. The time to cecum=3 minutes 33 seconds.  Withdrawal time=11 minutes 27 seconds.  The scope was withdrawn and the procedure completed. COMPLICATIONS: There were no  complications.  ENDOSCOPIC IMPRESSION: 1.   Two sessile polyps measuring 5 and 2 mm in size were found in the descending colon and rectosigmoid colon; Polypectomy was performed with cold forceps and using cold snare 2.   The colon mucosa was otherwise normal  RECOMMENDATIONS: 1.  Await pathology results 2.  Timing of repeat colonoscopy will be determined by pathology findings. 3.  You will receive a letter within 1-2 weeks with the results of your biopsy as well as final recommendations.  Please call my office if you have not received a letter after 3 weeks.   eSigned:  Jerene Bears, MD 09/02/2013 8:38 AM  cc: The Patient and Carolann Littler, MD

## 2013-09-02 NOTE — Progress Notes (Signed)
Called to room to assist during endoscopic procedure.  Patient ID and intended procedure confirmed with present staff. Received instructions for my participation in the procedure from the performing physician.  

## 2013-09-02 NOTE — Progress Notes (Signed)
A/ox3 pleased with MAC, report to April RN 

## 2013-09-03 ENCOUNTER — Telehealth: Payer: Self-pay

## 2013-09-03 NOTE — Telephone Encounter (Signed)
  Follow up Call-  Call back number 09/02/2013  Post procedure Call Back phone  # [phone]  Permission to leave phone message Yes     Patient questions:  Do you have a fever, pain , or abdominal swelling? no Pain Score  0 *  Have you tolerated food without any problems? yes  Have you been able to return to your normal activities? yes  Do you have any questions about your discharge instructions: Diet   no Medications  no Follow up visit  no  Do you have questions or concerns about your Care? no  Actions: * If pain score is 4 or above: No action needed, pain <4.

## 2013-09-05 ENCOUNTER — Encounter: Payer: BC Managed Care – PPO | Admitting: Internal Medicine

## 2013-09-08 ENCOUNTER — Encounter: Payer: Self-pay | Admitting: Internal Medicine

## 2013-12-04 ENCOUNTER — Other Ambulatory Visit: Payer: Self-pay | Admitting: Family Medicine

## 2014-03-02 ENCOUNTER — Other Ambulatory Visit: Payer: Self-pay | Admitting: Obstetrics and Gynecology

## 2014-03-09 LAB — CYTOLOGY - PAP

## 2014-03-30 ENCOUNTER — Encounter: Payer: Self-pay | Admitting: Podiatry

## 2014-03-30 ENCOUNTER — Ambulatory Visit (INDEPENDENT_AMBULATORY_CARE_PROVIDER_SITE_OTHER): Payer: BC Managed Care – PPO | Admitting: Podiatry

## 2014-03-30 ENCOUNTER — Ambulatory Visit (INDEPENDENT_AMBULATORY_CARE_PROVIDER_SITE_OTHER): Payer: BC Managed Care – PPO

## 2014-03-30 VITALS — BP 168/99 | HR 67 | Resp 16

## 2014-03-30 DIAGNOSIS — M722 Plantar fascial fibromatosis: Secondary | ICD-10-CM

## 2014-03-30 MED ORDER — TRIAMCINOLONE ACETONIDE 10 MG/ML IJ SUSP
10.0000 mg | Freq: Once | INTRAMUSCULAR | Status: AC
  Administered 2014-03-30: 10 mg

## 2014-03-30 NOTE — Progress Notes (Signed)
   Subjective:    Patient ID: Julie Savage, female    DOB: 04-23-1950, 63 y.o.   MRN: [id number]  HPI Comments: "I have pain in the heel"  Patient c/o aching plantar heel bilateral, left over right, for few months. No AM pain, but more after sitting for long periods and then standing. PCP recommended stretching. Some help.     Review of Systems  Constitutional: Positive for activity change.  HENT: Positive for tinnitus.   Eyes: Positive for visual disturbance.  Gastrointestinal: Positive for constipation and abdominal distention.  Skin: Positive for rash.  Allergic/Immunologic: Positive for food allergies.  Neurological: Positive for light-headedness.  Hematological: Bruises/bleeds easily.  Psychiatric/Behavioral: The patient is nervous/anxious.   All other systems reviewed and are negative.      Objective:   Physical Exam        Assessment & Plan:

## 2014-03-30 NOTE — Patient Instructions (Signed)

## 2014-04-01 NOTE — Progress Notes (Signed)
Subjective:     Patient ID: Julie Savage, female   DOB: 06-02-1950, 63 y.o.   MRN: [id number]  HPI patient states I have had a lot of pain in my heel left over right with inflammation and fluid buildup it's been present for several months   Review of Systems  All other systems reviewed and are negative.      Objective:   Physical Exam  Constitutional: She is oriented to person, place, and time.  Cardiovascular: Intact distal pulses.   Musculoskeletal: Normal range of motion.  Neurological: She is oriented to person, place, and time.  Skin: Skin is warm.  Nursing note and vitals reviewed.  neurovascular status intact muscle strength adequate with range of motion within normal limits. Patient has good digital perfusion is well oriented 3 with moderate depression of the arch and is noted to have exquisite discomfort plantar fascial left over right at the insertional point of the tendon into the calcaneus. Plantar fasciitis left over right of several months duration with foot structural issues     Assessment:     Read above    Plan:     H&P and x-rays reviewed. Injected the left plantar fascia 3 mg Kenalog 5 mg Xylocaine and applied fascial brace with instructions on usage placed on diclofenac 75 mg twice a day and reappoint to recheck

## 2014-04-06 ENCOUNTER — Encounter: Payer: Self-pay | Admitting: Podiatry

## 2014-04-06 ENCOUNTER — Ambulatory Visit (INDEPENDENT_AMBULATORY_CARE_PROVIDER_SITE_OTHER): Payer: BC Managed Care – PPO | Admitting: Podiatry

## 2014-04-06 VITALS — BP 160/85 | HR 74 | Resp 16

## 2014-04-06 DIAGNOSIS — M722 Plantar fascial fibromatosis: Secondary | ICD-10-CM

## 2014-04-08 NOTE — Progress Notes (Signed)
Subjective:     Patient ID: Julie Savage, female   DOB: [birth year], 63 y.o.   MRN: [id number]  HPI patient presents stating I'm still getting discomfort but it's improved from previous visit and the pain is no longer as intense   Review of Systems     Objective:   Physical Exam Neurovascular status intact with muscle strength adequate and range of motion within normal limits. Patient is noted to have discomfort in the plantar heel still present but improved from previously with inflammation at the insertion of the tendon into the calcaneus and also moderate depression of the arch noted    Assessment:     Plantar fasciitis of the heel region bilateral with foot structural changes moderate in intensity upon palpation    Plan:     Reviewed mechanical condition and discussed physical therapy supportive shoe gear usage and today scanned for custom orthotic devices. Reappoint when orthotics returned

## 2014-04-21 ENCOUNTER — Ambulatory Visit: Payer: 59 | Admitting: *Deleted

## 2014-04-21 DIAGNOSIS — M722 Plantar fascial fibromatosis: Secondary | ICD-10-CM

## 2014-04-21 NOTE — Progress Notes (Signed)
PATIENT PRESENTS FOR ORTHOTIC PICK UP

## 2014-04-21 NOTE — Patient Instructions (Signed)

## 2014-05-12 ENCOUNTER — Other Ambulatory Visit: Payer: Self-pay | Admitting: Obstetrics and Gynecology

## 2014-05-15 LAB — CYTOLOGY - PAP

## 2014-06-01 ENCOUNTER — Other Ambulatory Visit: Payer: Self-pay | Admitting: Family Medicine

## 2014-07-23 ENCOUNTER — Encounter: Payer: Self-pay | Admitting: Family Medicine

## 2014-07-27 ENCOUNTER — Other Ambulatory Visit (INDEPENDENT_AMBULATORY_CARE_PROVIDER_SITE_OTHER): Payer: 59

## 2014-07-27 DIAGNOSIS — Z0000 Encounter for general adult medical examination without abnormal findings: Secondary | ICD-10-CM

## 2014-07-27 LAB — LIPID PANEL
CHOL/HDL RATIO: 2
Cholesterol: 177 mg/dL (ref 0–200)
HDL: 79.3 mg/dL (ref >39.00)
LDL Cholesterol: 87 mg/dL (ref 0–99)
NonHDL: 97.7
TRIGLYCERIDES: 54 mg/dL (ref 0.0–149.0)
VLDL: 10.8 mg/dL (ref 0.0–40.0)

## 2014-07-27 LAB — CBC WITH DIFFERENTIAL/PLATELET
Basophils Absolute: 0 10*3/uL (ref 0.0–0.1)
Basophils Relative: 0.4 % (ref 0.0–3.0)
Eosinophils Absolute: 0 10*3/uL (ref 0.0–0.7)
Eosinophils Relative: 1.3 % (ref 0.0–5.0)
HEMATOCRIT: 37.1 % (ref 36.0–46.0)
Hemoglobin: 12.5 g/dL (ref 12.0–15.0)
Lymphocytes Relative: 38.9 % (ref 12.0–46.0)
Lymphs Abs: 1.4 10*3/uL (ref 0.7–4.0)
MCHC: 33.7 g/dL (ref 30.0–36.0)
MCV: 90.8 fl (ref 78.0–100.0)
MONO ABS: 0.2 10*3/uL (ref 0.1–1.0)
Monocytes Relative: 4.3 % (ref 3.0–12.0)
NEUTROS ABS: 2 10*3/uL (ref 1.4–7.7)
Neutrophils Relative %: 55.1 % (ref 43.0–77.0)
Platelets: 361 10*3/uL (ref 150.0–400.0)
RBC: 4.09 Mil/uL (ref 3.87–5.11)
RDW: 13.3 % (ref 11.5–15.5)
WBC: 3.7 10*3/uL — AB (ref 4.0–10.5)

## 2014-07-27 LAB — BASIC METABOLIC PANEL
BUN: 15 mg/dL (ref 6–23)
CO2: 29 mEq/L (ref 19–32)
Calcium: 9.4 mg/dL (ref 8.4–10.5)
Chloride: 105 mEq/L (ref 96–112)
Creatinine, Ser: 0.65 mg/dL (ref 0.40–1.20)
GFR: 97.66 mL/min (ref >60.00)
GLUCOSE: 99 mg/dL (ref 70–99)
POTASSIUM: 4.5 meq/L (ref 3.5–5.1)
Sodium: 142 mEq/L (ref 135–145)

## 2014-07-27 LAB — HEPATIC FUNCTION PANEL
ALT: 14 U/L (ref 0–35)
AST: 17 U/L (ref 0–37)
Albumin: 4.2 g/dL (ref 3.5–5.2)
Alkaline Phosphatase: 53 U/L (ref 39–117)
Bilirubin, Direct: 0.1 mg/dL (ref 0.0–0.3)
TOTAL PROTEIN: 7.1 g/dL (ref 6.0–8.3)
Total Bilirubin: 0.7 mg/dL (ref 0.2–1.2)

## 2014-07-27 LAB — TSH: TSH: 1.19 u[IU]/mL (ref 0.35–4.50)

## 2014-07-29 ENCOUNTER — Encounter: Payer: Self-pay | Admitting: Family Medicine

## 2014-07-29 ENCOUNTER — Ambulatory Visit (INDEPENDENT_AMBULATORY_CARE_PROVIDER_SITE_OTHER): Payer: 59 | Admitting: Family Medicine

## 2014-07-29 ENCOUNTER — Ambulatory Visit (INDEPENDENT_AMBULATORY_CARE_PROVIDER_SITE_OTHER)
Admission: RE | Admit: 2014-07-29 | Discharge: 2014-07-29 | Disposition: A | Discharge status: Home / Self Care | Payer: 59 | Source: Ambulatory Visit | Attending: Family Medicine | Admitting: Family Medicine

## 2014-07-29 VITALS — BP 134/84 | HR 74 | Temp 98.1°F | Ht 64.0 in | Wt 118.0 lb

## 2014-07-29 DIAGNOSIS — M810 Age-related osteoporosis without current pathological fracture: Secondary | ICD-10-CM | POA: Diagnosis not present

## 2014-07-29 DIAGNOSIS — Z0000 Encounter for general adult medical examination without abnormal findings: Secondary | ICD-10-CM | POA: Diagnosis not present

## 2014-07-29 MED ORDER — AMLODIPINE BESYLATE 2.5 MG PO TABS: ORAL_TABLET | ORAL | Status: DC

## 2014-07-29 NOTE — Progress Notes (Signed)
Pre visit review using our clinic review tool, if applicable. No additional management support is needed unless otherwise documented below in the visit note. 

## 2014-07-29 NOTE — Progress Notes (Signed)
   Subjective:    Patient ID: Julie Savage, female    DOB: [birth year], 64 y.o.   MRN: [id number]  HPI  Patient seen for complete physical. She sees gynecologist regularly. She has history of hypertension and white coat syndrome. Blood pressures been very well controlled by home readings. She exercises with walking regularly. Last DEXA scan November 2013 with T score -2.6. She has been on Fosamax for about 5 years. She takes calcium and vitamin D. Immunizations up-to-date. Mammograms up-to-date. Colonoscopy up-to-date.  Past Medical History  Diagnosis Date  . HYPERTENSION 10/27/2008  . AMNESIA, TRANSIENT GLOBAL 11/24/2008  . ASTHMA 10/27/2008  . OSTEOPOROSIS 10/27/2008   Past Surgical History  Procedure Laterality Date  . Dental implants    . Colonoscopy      reports that she has never smoked. She has never used smokeless tobacco. She reports that she drinks alcohol. She reports that she does not use illicit drugs. family history includes Cancer in her sister; Colon cancer (age of onset: 53) in her brother; Diabetes in her brother; Heart disease (age of onset: 80) in her mother. Allergies  Allergen Reactions  . Aspirin   . Penicillins      Review of Systems  Constitutional: Negative for fever, activity change, appetite change, fatigue and unexpected weight change.  HENT: Negative for ear pain, hearing loss, sore throat and trouble swallowing.   Eyes: Negative for visual disturbance.  Respiratory: Negative for cough and shortness of breath.   Cardiovascular: Negative for chest pain and palpitations.  Gastrointestinal: Negative for abdominal pain, diarrhea, constipation and blood in stool.  Genitourinary: Negative for dysuria and hematuria.  Musculoskeletal: Negative for myalgias, back pain and arthralgias.  Skin: Negative for rash.  Neurological: Negative for dizziness, syncope and headaches.  Hematological: Negative for adenopathy.  Psychiatric/Behavioral: Negative for  confusion and dysphoric mood.       Objective:   Physical Exam  Constitutional: She is oriented to person, place, and time. She appears well-developed and well-nourished.  HENT:  Head: Normocephalic and atraumatic.  Eyes: EOM are normal. Pupils are equal, round, and reactive to light.  Neck: Normal range of motion. Neck supple. No thyromegaly present.  Cardiovascular: Normal rate, regular rhythm and normal heart sounds.   No murmur heard. Pulmonary/Chest: Breath sounds normal. No respiratory distress. She has no wheezes. She has no rales.  Abdominal: Soft. Bowel sounds are normal. She exhibits no distension and no mass. There is no tenderness. There is no rebound and no guarding.  Genitourinary:  Per gyn   Musculoskeletal: Normal range of motion. She exhibits no edema.  Lymphadenopathy:    She has no cervical adenopathy.  Neurological: She is alert and oriented to person, place, and time. She displays normal reflexes. No cranial nerve deficit.  Skin: No rash noted.  Psychiatric: She has a normal mood and affect. Her behavior is normal. Judgment and thought content normal.          Assessment & Plan:  Complete physical. Labs reviewed with no major concerns. Continue regular weightbearing exercise and adequate calcium and vitamin D. Set up repeat DEXA scan. We discussed possible two-year holiday off Fosamax since she has been off this for about 5 years. Consider vitamin D level with labs next year

## 2014-07-29 NOTE — Patient Instructions (Signed)
Osteoporosis Throughout your life, your body breaks down old bone and replaces it with new bone. As you get older, your body does not replace bone as quickly as it breaks it down. By the age of 30 years, most people begin to gradually lose bone because of the imbalance between bone loss and replacement. Some people lose more bone than others. Bone loss beyond a specified normal degree is considered osteoporosis.  Osteoporosis affects the strength and durability of your bones. The inside of the ends of your bones and your flat bones, like the bones of your pelvis, look like honeycomb, filled with tiny open spaces. As bone loss occurs, your bones become less dense. This means that the open spaces inside your bones become bigger and the walls between these spaces become thinner. This makes your bones weaker. Bones of a person with osteoporosis can become so weak that they can break (fracture) during minor accidents, such as a simple fall. CAUSES  The following factors have been associated with the development of osteoporosis:  Smoking.  Drinking more than 2 alcoholic drinks several days per week.  Long-term use of certain medicines:  Corticosteroids.  Chemotherapy medicines.  Thyroid medicines.  Antiepileptic medicines.  Gonadal hormone suppression medicine.  Immunosuppression medicine.  Being underweight.  Lack of physical activity.  Lack of exposure to the sun. This can lead to vitamin D deficiency.  Certain medical conditions:  Certain inflammatory bowel diseases, such as Crohn disease and ulcerative colitis.  Diabetes.  Hyperthyroidism.  Hyperparathyroidism. RISK FACTORS Anyone can develop osteoporosis. However, the following factors can increase your risk of developing osteoporosis:  Gender--Women are at higher risk than men.  Age--Being older than 50 years increases your risk.  Ethnicity--White and Asian people have an increased risk.  Weight --Being extremely  underweight can increase your risk of osteoporosis.  Family history of osteoporosis--Having a family member who has developed osteoporosis can increase your risk. SYMPTOMS  Usually, people with osteoporosis have no symptoms.  DIAGNOSIS  Signs during a physical exam that may prompt your caregiver to suspect osteoporosis include:  Decreased height. This is usually caused by the compression of the bones that form your spine (vertebrae) because they have weakened and become fractured.  A curving or rounding of the upper back (kyphosis). To confirm signs of osteoporosis, your caregiver may request a procedure that uses 2 low-dose X-ray beams with different levels of energy to measure your bone mineral density (dual-energy X-ray absorptiometry [DXA]). Also, your caregiver may check your level of vitamin D. TREATMENT  The goal of osteoporosis treatment is to strengthen bones in order to decrease the risk of bone fractures. There are different types of medicines available to help achieve this goal. Some of these medicines work by slowing the processes of bone loss. Some medicines work by increasing bone density. Treatment also involves making sure that your levels of calcium and vitamin D are adequate. PREVENTION  There are things you can do to help prevent osteoporosis. Adequate intake of calcium and vitamin D can help you achieve optimal bone mineral density. Regular exercise can also help, especially resistance and weight-bearing activities. If you smoke, quitting smoking is an important part of osteoporosis prevention. MAKE SURE YOU:  Understand these instructions.  Will watch your condition.  Will get help right away if you are not doing well or get worse. FOR MORE INFORMATION www.osteo.org and www.nof.org Document Released: 01/04/2005 Document Revised: 07/22/2012 Document Reviewed: 03/11/2011 Exit[employer] Patient Information 2015 [employer]. This information is not   intended to replace advice  given to you by your health care provider. Make sure you discuss any questions you have with your health care provider.  

## 2014-07-30 ENCOUNTER — Encounter: Payer: Self-pay | Admitting: Family Medicine

## 2014-07-30 ENCOUNTER — Inpatient Hospital Stay: Admission: RE | Admit: 2014-07-30 | Payer: 59 | Source: Ambulatory Visit

## 2014-08-03 ENCOUNTER — Ambulatory Visit: Payer: 59 | Admitting: Podiatry

## 2014-08-06 ENCOUNTER — Telehealth: Payer: Self-pay | Admitting: Family Medicine

## 2014-08-06 MED ORDER — AMLODIPINE BESYLATE 2.5 MG PO TABS: ORAL_TABLET | ORAL | Status: DC

## 2014-08-06 NOTE — Telephone Encounter (Signed)
Rx sent to mail order

## 2014-08-06 NOTE — Telephone Encounter (Signed)
Patient states amLODipine (NORVASC) 2.5 MG tablet should have gone to [facility] order.  They no longer use prime.  Can you please re-send?  I advised patient and her husband they need to fill out a DPR in order for Gizzelle Lacomb to obtain information on her account.

## 2014-11-04 ENCOUNTER — Other Ambulatory Visit: Payer: Self-pay | Admitting: Obstetrics and Gynecology

## 2014-11-09 LAB — CYTOLOGY - PAP

## 2015-02-04 ENCOUNTER — Telehealth: Payer: Self-pay | Admitting: Family Medicine

## 2015-02-04 ENCOUNTER — Ambulatory Visit (INDEPENDENT_AMBULATORY_CARE_PROVIDER_SITE_OTHER): Payer: 59

## 2015-02-04 DIAGNOSIS — Z23 Encounter for immunization: Secondary | ICD-10-CM

## 2015-02-04 NOTE — Telephone Encounter (Signed)
On review of her chart she just did a pap with her gynecologist. Would advise follow up with her gyn office or Dr. Elease Hashimoto regarding this. Thanks.

## 2015-02-04 NOTE — Telephone Encounter (Signed)
Patient informed of the message below and she stated she wants a second opinion for her pap.  I advised she contact her gynecologist's office and ask for this.

## 2015-02-04 NOTE — Telephone Encounter (Signed)
Patient states that she wants to stay with [facility] as her p[facility], however she wants to know if she can get Dr. Maudie Mercury to do her pap smear when it comes due.  She feels more comfortable having a female doctor do it. The call back phone number is her husband's cell.  She said it was ok to talk to him because she will be out of town.

## 2015-05-03 ENCOUNTER — Other Ambulatory Visit: Payer: Self-pay

## 2015-05-03 DIAGNOSIS — Z1231 Encounter for screening mammogram for malignant neoplasm of breast: Secondary | ICD-10-CM

## 2015-05-21 ENCOUNTER — Ambulatory Visit
Admission: RE | Admit: 2015-05-21 | Discharge: 2015-05-21 | Disposition: A | Discharge status: Home / Self Care | Payer: 59 | Source: Ambulatory Visit

## 2015-05-21 DIAGNOSIS — Z1231 Encounter for screening mammogram for malignant neoplasm of breast: Secondary | ICD-10-CM

## 2015-08-21 ENCOUNTER — Emergency Department (HOSPITAL_COMMUNITY): Payer: 59

## 2015-08-21 ENCOUNTER — Encounter (HOSPITAL_COMMUNITY): Payer: Self-pay | Admitting: Emergency Medicine

## 2015-08-21 ENCOUNTER — Emergency Department (HOSPITAL_COMMUNITY)
Admission: EM | Admit: 2015-08-21 | Discharge: 2015-08-21 | Disposition: A | Discharge status: Home / Self Care | Payer: 59 | Attending: Emergency Medicine | Admitting: Emergency Medicine

## 2015-08-21 DIAGNOSIS — F419 Anxiety disorder, unspecified: Secondary | ICD-10-CM

## 2015-08-21 DIAGNOSIS — Z79899 Other long term (current) drug therapy: Secondary | ICD-10-CM | POA: Insufficient documentation

## 2015-08-21 DIAGNOSIS — R079 Chest pain, unspecified: Secondary | ICD-10-CM | POA: Diagnosis present

## 2015-08-21 DIAGNOSIS — Z880 Allergy status to penicillin: Secondary | ICD-10-CM | POA: Diagnosis not present

## 2015-08-21 DIAGNOSIS — I10 Essential (primary) hypertension: Secondary | ICD-10-CM | POA: Diagnosis not present

## 2015-08-21 DIAGNOSIS — J45909 Unspecified asthma, uncomplicated: Secondary | ICD-10-CM | POA: Insufficient documentation

## 2015-08-21 LAB — CBC
HCT: 37.8 % (ref 36.0–46.0)
Hemoglobin: 12.5 g/dL (ref 12.0–15.0)
MCH: 30.5 pg (ref 26.0–34.0)
MCHC: 33.1 g/dL (ref 30.0–36.0)
MCV: 92.2 fL (ref 78.0–100.0)
PLATELETS: 292 10*3/uL (ref 150–400)
RBC: 4.1 MIL/uL (ref 3.87–5.11)
RDW: 13.1 % (ref 11.5–15.5)
WBC: 4.2 10*3/uL (ref 4.0–10.5)

## 2015-08-21 LAB — BASIC METABOLIC PANEL
Anion gap: 10 (ref 5–15)
BUN: 12 mg/dL (ref 6–20)
CO2: 27 mmol/L (ref 22–32)
CREATININE: 0.69 mg/dL (ref 0.44–1.00)
Calcium: 9.3 mg/dL (ref 8.9–10.3)
Chloride: 103 mmol/L (ref 101–111)
GFR calc Af Amer: >60 mL/min (ref >60)
Glucose, Bld: 120 mg/dL — ABNORMAL HIGH (ref 65–99)
Potassium: 3.4 mmol/L — ABNORMAL LOW (ref 3.5–5.1)
SODIUM: 140 mmol/L (ref 135–145)

## 2015-08-21 LAB — I-STAT TROPONIN, ED
TROPONIN I, POC: 0 ng/mL (ref 0.00–0.08)
Troponin i, poc: 0 ng/mL (ref 0.00–0.08)

## 2015-08-21 MED ORDER — SODIUM CHLORIDE 0.9 % IV SOLN
INTRAVENOUS | Status: DC
  Administered 2015-08-21: 05:00:00 via INTRAVENOUS

## 2015-08-21 MED ORDER — ONDANSETRON HCL 4 MG/2ML IJ SOLN
4.0000 mg | Freq: Once | INTRAMUSCULAR | Status: AC
  Administered 2015-08-21: 4 mg via INTRAVENOUS
  Filled 2015-08-21: qty 2

## 2015-08-21 MED ORDER — MORPHINE SULFATE (PF) 2 MG/ML IV SOLN
2.0000 mg | Freq: Once | INTRAVENOUS | Status: AC
  Administered 2015-08-21: 2 mg via INTRAVENOUS
  Filled 2015-08-21: qty 1

## 2015-08-21 NOTE — ED Notes (Signed)
Pt in reporting central CP that woke pt up from sleep. No radiation. Sharp in nature. SOB. Pt very anxious

## 2015-08-21 NOTE — Discharge Instructions (Signed)
Nonspecific Chest Pain  °Chest pain can be caused by many different conditions. There is always a chance that your pain could be related to something serious, such as a heart attack or a blood clot in your lungs. Chest pain can also be caused by conditions that are not life-threatening. If you have chest pain, it is very important to follow up with your health care provider. °CAUSES  °Chest pain can be caused by: °· Heartburn. °· Pneumonia or bronchitis. °· Anxiety or stress. °· Inflammation around your heart (pericarditis) or lung (pleuritis or pleurisy). °· A blood clot in your lung. °· A collapsed lung (pneumothorax). It can develop suddenly on its own (spontaneous pneumothorax) or from trauma to the chest. °· Shingles infection (varicella-zoster virus). °· Heart attack. °· Damage to the bones, muscles, and cartilage that make up your chest wall. This can include: °¨ Bruised bones due to injury. °¨ Strained muscles or cartilage due to frequent or repeated coughing or overwork. °¨ Fracture to one or more ribs. °¨ Sore cartilage due to inflammation (costochondritis). °RISK FACTORS  °Risk factors for chest pain may include: °· Activities that increase your risk for trauma or injury to your chest. °· Respiratory infections or conditions that cause frequent coughing. °· Medical conditions or overeating that can cause heartburn. °· Heart disease or family history of heart disease. °· Conditions or health behaviors that increase your risk of developing a blood clot. °· Having had chicken pox (varicella zoster). °SIGNS AND SYMPTOMS °Chest pain can feel like: °· Burning or tingling on the surface of your chest or deep in your chest. °· Crushing, pressure, aching, or squeezing pain. °· Dull or sharp pain that is worse when you move, cough, or take a deep breath. °· Pain that is also felt in your back, neck, shoulder, or arm, or pain that spreads to any of these areas. °Your chest pain may come and go, or it may stay  constant. °DIAGNOSIS °Lab tests or other studies may be needed to find the cause of your pain. Your health care provider may have you take a test called an ambulatory ECG (electrocardiogram). An ECG records your heartbeat patterns at the time the test is performed. You may also have other tests, such as: °· Transthoracic echocardiogram (TTE). During echocardiography, sound waves are used to create a picture of all of the heart structures and to look at how blood flows through your heart. °· Transesophageal echocardiogram (TEE). This is a more advanced imaging test that obtains images from inside your body. It allows your health care provider to see your heart in finer detail. °· Cardiac monitoring. This allows your health care provider to monitor your heart rate and rhythm in real time. °· Holter monitor. This is a portable device that records your heartbeat and can help to diagnose abnormal heartbeats. It allows your health care provider to track your heart activity for several days, if needed. °· Stress tests. These can be done through exercise or by taking medicine that makes your heart beat more quickly. °· Blood tests. °· Imaging tests. °TREATMENT  °Your treatment depends on what is causing your chest pain. Treatment may include: °· Medicines. These may include: °¨ Acid blockers for heartburn. °¨ Anti-inflammatory medicine. °¨ Pain medicine for inflammatory conditions. °¨ Antibiotic medicine, if an infection is present. °¨ Medicines to dissolve blood clots. °¨ Medicines to treat coronary artery disease. °· Supportive care for conditions that do not require medicines. This may include: °¨ Resting. °¨ Applying heat   or cold packs to injured areas. °¨ Limiting activities until pain decreases. °HOME CARE INSTRUCTIONS °· If you were prescribed an antibiotic medicine, finish it all even if you start to feel better. °· Avoid any activities that bring on chest pain. °· Do not use any tobacco products, including  cigarettes, chewing tobacco, or electronic cigarettes. If you need help quitting, ask your health care provider. °· Do not drink alcohol. °· Take medicines only as directed by your health care provider. °· Keep all follow-up visits as directed by your health care provider. This is important. This includes any further testing if your chest pain does not go away. °· If heartburn is the cause for your chest pain, you may be told to keep your head raised (elevated) while sleeping. This reduces the chance that acid will go from your stomach into your esophagus. °· Make lifestyle changes as directed by your health care provider. These may include: °¨ Getting regular exercise. Ask your health care provider to suggest some activities that are safe for you. °¨ Eating a heart-healthy diet. A registered dietitian can help you to learn healthy eating options. °¨ Maintaining a healthy weight. °¨ Managing diabetes, if necessary. °¨ Reducing stress. °SEEK MEDICAL CARE IF: °· Your chest pain does not go away after treatment. °· You have a rash with blisters on your chest. °· You have a fever. °SEEK IMMEDIATE MEDICAL CARE IF:  °· Your chest pain is worse. °· You have an increasing cough, or you cough up blood. °· You have severe abdominal pain. °· You have severe weakness. °· You faint. °· You have chills. °· You have sudden, unexplained chest discomfort. °· You have sudden, unexplained discomfort in your arms, back, neck, or jaw. °· You have shortness of breath at any time. °· You suddenly start to sweat, or your skin gets clammy. °· You feel nauseous or you vomit. °· You suddenly feel light-headed or dizzy. °· Your heart begins to beat quickly, or it feels like it is skipping beats. °These symptoms may represent a serious problem that is an emergency. Do not wait to see if the symptoms will go away. Get medical help right away. Call your local emergency services (911 in the [place] Do not drive yourself to the hospital. °  °This  information is not intended to replace advice given to you by your health care provider. Make sure you discuss any questions you have with your health care provider. °  °Document Released: 01/04/2005 Document Revised: 04/17/2014 Document Reviewed: 10/31/2013 °Elsevier Interactive Patient Education ©2016 Else[employer]. ° °

## 2015-08-21 NOTE — ED Provider Notes (Signed)
  Physical Exam  BP 163/97 mmHg  Pulse 66  Temp(Src) 98 F (36.7 C) (Oral)  Resp 12  Ht 5\' 5"  (1.651 m)  Wt 49.896 kg  BMI 18.31 kg/m2  SpO2 100%  Physical Exam  ED Course  Procedures  MDM 6:09 AM- Sign out from Delos Haring, PA-C CP 3AM. Substernal. No radiation. Hx Anxiety, HTN CP controlled in ED with pain medicaion Initial Trop Neg. CXR Neg.  Heart Score 2 Delta Trop. If negative, DC home with follow up to Cardiology  At time of discharge, Patient is in no acute distress. Vital Signs are stable. Patient is able to ambulate. Patient able to tolerate PO.         Shary Decamp, PA-C 08/21/15 [place], MD 08/21/15 [id number]

## 2015-08-21 NOTE — ED Provider Notes (Signed)
CSN: [id number]     Arrival date & time 08/21/15  0408 History   First MD Initiated Contact with Patient 08/21/15 [id number]     Chief Complaint  Patient presents with  . Chest Pain     (Consider location/radiation/quality/duration/timing/severity/associated sxs/prior Treatment) HPI   Patient has PMH of hypertension, transient global amnesia, asthma comes to the ER with complaints of central CP that woke him up from sleep this morning. The pain is not radiating and feels sharp. She is very anxious and feels short of breath.   She reports waking up at 3 am this morning with shaking which is consistent with her anxiety but it then turned into mid-substernal CP that has been waxing and waning between 4-6/10 but has not resolved. She reports having a significant history of anxiety but has never had chest pains with this.  She has not had any burping, burning in her throat or chest, SOB, N/V/D/ no diaphoresis or radiation of the pain. No le swelling, recent injury. She is allergic to aspirin and currently having pain 4/10.  Past Medical History  Diagnosis Date  . HYPERTENSION 10/27/2008  . AMNESIA, TRANSIENT GLOBAL 11/24/2008  . ASTHMA 10/27/2008  . OSTEOPOROSIS 10/27/2008   Past Surgical History  Procedure Laterality Date  . Dental implants    . Colonoscopy     Family History  Problem Relation Age of Onset  . Heart disease Mother 51    CVA  . Cancer Sister     breast  . Diabetes Brother     type ll  . Colon cancer Brother 28   Social History  Substance Use Topics  . Smoking status: Never Smoker   . Smokeless tobacco: Never Used  . Alcohol Use: Yes     Comment: occasionally   OB History    No data available     Review of Systems  Review of Systems All other systems negative except as documented in the HPI. All pertinent positives and negatives as reviewed in the HPI.   Allergies  Aspirin and Penicillins  Home Medications   Prior to Admission medications   Medication Sig  Start Date End Date Taking? Authorizing Provider  amLODipine (NORVASC) 2.5 MG tablet TAKE 1 BY MOUTH DAILY 08/06/14  Yes Eulas Post, MD  Calcium Carbonate-Vitamin D (CALCIUM-VITAMIN D) 500-200 MG-UNIT per tablet Take 1 tablet by mouth 2 (two) times daily with a meal.     Yes Historical Provider, MD  fish oil-omega-3 fatty acids 1000 MG capsule Take 1 g by mouth daily. One tab every other day   Yes Historical Provider, MD  Multiple Vitamins-Minerals (CENTRUM SILVER PO) Take 1 tablet by mouth daily.    Yes Historical Provider, MD   BP 163/97 mmHg  Pulse 66  Temp(Src) 98 F (36.7 C) (Oral)  Resp 12  Ht 5\' 5"  (1.651 m)  Wt 49.896 kg  BMI 18.31 kg/m2  SpO2 100% Physical Exam  Constitutional: She appears well-developed and well-nourished. No distress.  HENT:  Head: Normocephalic and atraumatic.  Nose: Nose normal.  Mouth/Throat: Uvula is midline, oropharynx is clear and moist and mucous membranes are normal.  Eyes: Pupils are equal, round, and reactive to light.  Neck: Normal range of motion. Neck supple.  Cardiovascular: Normal rate and regular rhythm.   Pulmonary/Chest: Effort normal and breath sounds normal. No accessory muscle usage. No respiratory distress. She exhibits no tenderness.  Abdominal: Soft. Bowel sounds are normal. There is no tenderness. There is no rigidity, no rebound,  no guarding and no CVA tenderness.  No signs of abdominal distention  Musculoskeletal:  No LE swelling  Neurological: She is alert.  Acting at baseline  Skin: Skin is warm and dry. No rash noted.  Psychiatric: Her mood appears anxious.  Nursing note and vitals reviewed.   ED Course  Procedures (including critical care time) Labs Review Labs Reviewed  BASIC METABOLIC PANEL - Abnormal; Notable for the following:    Potassium 3.4 (*)    Glucose, Bld 120 (*)    All other components within normal limits  CBC  I-STAT TROPOININ, ED    Imaging Review Dg Chest 2 View  08/21/2015  CLINICAL  DATA:  Midchest pain and dyspnea, onset today EXAM: CHEST  2 VIEW COMPARISON:  11/21/2008 FINDINGS: The heart size and mediastinal contours are within normal limits. Both lungs are clear. The visualized skeletal structures are unremarkable. IMPRESSION: No active cardiopulmonary disease. Electronically Signed   By: Andreas Newport M.D.   On: 08/21/2015 05:30   I have personally reviewed and evaluated these images and lab results as part of my medical decision-making.   EKG Interpretation   Date/Time:  Saturday Aug 21 2015 04:14:27 EDT Ventricular Rate:  80 PR Interval:  151 QRS Duration: 100 QT Interval:  388 QTC Calculation: 448 R Axis:   69 Text Interpretation:  Sinus rhythm RSR' in V1 or V2, probably normal  variant No significant change since last tracing Confirmed by KNOTT MD,  DANIEL [id number]) on 08/21/2015 4:22:07 AM      MDM   Final diagnoses:  None   Case discussed with Dr. Dina Rich. Aside from age she is very low risk for PE as story is not consistent. She has a heart score of 2 (hyeprtension and age) Will obtain a delta troponin and 3 hours. If this is negative and patient is doing better, will discharge with cardiology follow-up.  At end of shift patient hand off to Shary Decamp, PA-C. Pt waiting for delta Trop at 7:45 am   Delos Haring, PA-C 08/21/15 [id number]  Merryl Hacker, MD 08/21/15 [phone]

## 2015-08-23 ENCOUNTER — Other Ambulatory Visit: Payer: Self-pay | Admitting: Family Medicine

## 2015-08-27 ENCOUNTER — Ambulatory Visit (INDEPENDENT_AMBULATORY_CARE_PROVIDER_SITE_OTHER): Payer: 59 | Admitting: Family Medicine

## 2015-08-27 VITALS — BP 170/100 | HR 76 | Temp 98.1°F | Ht 63.75 in | Wt 111.1 lb

## 2015-08-27 DIAGNOSIS — I10 Essential (primary) hypertension: Secondary | ICD-10-CM | POA: Diagnosis not present

## 2015-08-27 DIAGNOSIS — Z0000 Encounter for general adult medical examination without abnormal findings: Secondary | ICD-10-CM | POA: Diagnosis not present

## 2015-08-27 LAB — CBC WITH DIFFERENTIAL/PLATELET
BASOS ABS: 0 10*3/uL (ref 0.0–0.1)
Basophils Relative: 0.5 % (ref 0.0–3.0)
EOS ABS: 0 10*3/uL (ref 0.0–0.7)
Eosinophils Relative: 0.6 % (ref 0.0–5.0)
HCT: 36.3 % (ref 36.0–46.0)
HEMOGLOBIN: 12.1 g/dL (ref 12.0–15.0)
LYMPHS PCT: 28.3 % (ref 12.0–46.0)
Lymphs Abs: 1.2 10*3/uL (ref 0.7–4.0)
MCHC: 33.4 g/dL (ref 30.0–36.0)
MCV: 90.9 fl (ref 78.0–100.0)
MONOS PCT: 4.4 % (ref 3.0–12.0)
Monocytes Absolute: 0.2 10*3/uL (ref 0.1–1.0)
NEUTROS ABS: 2.8 10*3/uL (ref 1.4–7.7)
Neutrophils Relative %: 66.2 % (ref 43.0–77.0)
Platelets: 358 10*3/uL (ref 150.0–400.0)
RBC: 3.99 Mil/uL (ref 3.87–5.11)
RDW: 13.6 % (ref 11.5–15.5)
WBC: 4.3 10*3/uL (ref 4.0–10.5)

## 2015-08-27 LAB — LIPID PANEL
CHOL/HDL RATIO: 2
Cholesterol: 174 mg/dL (ref 0–200)
HDL: 71.3 mg/dL (ref >39.00)
LDL CALC: 95 mg/dL (ref 0–99)
NonHDL: 102.58
TRIGLYCERIDES: 39 mg/dL (ref 0.0–149.0)
VLDL: 7.8 mg/dL (ref 0.0–40.0)

## 2015-08-27 LAB — BASIC METABOLIC PANEL
BUN: 13 mg/dL (ref 6–23)
CALCIUM: 9.5 mg/dL (ref 8.4–10.5)
CO2: 29 meq/L (ref 19–32)
CREATININE: 0.67 mg/dL (ref 0.40–1.20)
Chloride: 102 mEq/L (ref 96–112)
GFR: 93.98 mL/min (ref >60.00)
Glucose, Bld: 90 mg/dL (ref 70–99)
Potassium: 4.3 mEq/L (ref 3.5–5.1)
Sodium: 140 mEq/L (ref 135–145)

## 2015-08-27 LAB — HEPATIC FUNCTION PANEL
ALK PHOS: 53 U/L (ref 39–117)
ALT: 9 U/L (ref 0–35)
AST: 15 U/L (ref 0–37)
Albumin: 4.4 g/dL (ref 3.5–5.2)
BILIRUBIN DIRECT: 0.2 mg/dL (ref 0.0–0.3)
BILIRUBIN TOTAL: 0.7 mg/dL (ref 0.2–1.2)
TOTAL PROTEIN: 6.8 g/dL (ref 6.0–8.3)

## 2015-08-27 LAB — TSH: TSH: 0.9 u[IU]/mL (ref 0.35–4.50)

## 2015-08-27 MED ORDER — AMLODIPINE BESYLATE 5 MG PO TABS: 5.0000 mg | ORAL_TABLET | Freq: Every day | ORAL | Status: DC

## 2015-08-27 NOTE — Progress Notes (Signed)
Pre visit review using our clinic review tool, if applicable. No additional management support is needed unless otherwise documented below in the visit note. 

## 2015-08-27 NOTE — Progress Notes (Signed)
Subjective:    Patient ID: Julie Savage, female    DOB: 1950-09-14, 65 y.o.   MRN: [id number]  HPI Patient here for physical exam Recent ER visit for atypical chest pain. Troponins negative. Patient has pending follow-up with cardiology. No history of CAD. No chest pain since ER visit. No family history of premature CAD. Stays very active. No recent exertional chest pains  Patient's had Pap smears through GYN past. She states last couple Pap smears have been "unreadable " Not sure but thinks she is referring to the fact that there has been no transitional zone? She would like someone else to do her Pap smear this year. She is requesting female provider. No history of abnormalities in the past. Low risk. She is monogamous with no history of HPV  Hypertension. Consistently well controlled by home readings. She has brought her blood pressure cuff here twice in the past and is gotten consistent readings with ours. Home blood pressure reading this morning 120/80 and consistently in this range. She is consistently high when she is here. We have previously discussed possible 24 hour able to worry blood pressure monitor. Currently on amlodipine 2.5 mg once daily  Immunizations up-to-date  Past history of transient global amnesia. No further episodes  Past Medical History  Diagnosis Date  . HYPERTENSION 10/27/2008  . AMNESIA, TRANSIENT GLOBAL 11/24/2008  . ASTHMA 10/27/2008  . OSTEOPOROSIS 10/27/2008   Past Surgical History  Procedure Laterality Date  . Dental implants    . Colonoscopy      reports that she has never smoked. She has never used smokeless tobacco. She reports that she drinks alcohol. She reports that she does not use illicit drugs. family history includes Cancer in her sister; Colon cancer (age of onset: 55) in her brother; Diabetes in her brother; Heart disease (age of onset: 45) in her mother. Allergies  Allergen Reactions  . Aspirin Rash  . Penicillins Rash       Review of Systems  Constitutional: Negative for fever, activity change, appetite change, fatigue and unexpected weight change.  HENT: Negative for ear pain, hearing loss, sore throat and trouble swallowing.   Eyes: Negative for visual disturbance.  Respiratory: Negative for cough and shortness of breath.   Cardiovascular: Negative for chest pain and palpitations.  Gastrointestinal: Negative for abdominal pain, diarrhea, constipation and blood in stool.  Genitourinary: Negative for dysuria and hematuria.  Musculoskeletal: Negative for myalgias, back pain and arthralgias.  Skin: Negative for rash.  Neurological: Negative for dizziness, syncope and headaches.  Hematological: Negative for adenopathy.  Psychiatric/Behavioral: Negative for confusion and dysphoric mood.       Objective:   Physical Exam  Constitutional: She is oriented to person, place, and time. She appears well-developed and well-nourished.  HENT:  Head: Normocephalic and atraumatic.  Eyes: EOM are normal. Pupils are equal, round, and reactive to light.  Neck: Normal range of motion. Neck supple. No thyromegaly present.  Cardiovascular: Normal rate, regular rhythm and normal heart sounds.   No murmur heard. Pulmonary/Chest: Breath sounds normal. No respiratory distress. She has no wheezes. She has no rales.  Abdominal: Soft. Bowel sounds are normal. She exhibits no distension and no mass. There is no tenderness. There is no rebound and no guarding.  Musculoskeletal: Normal range of motion. She exhibits no edema.  Lymphadenopathy:    She has no cervical adenopathy.  Neurological: She is alert and oriented to person, place, and time. She displays normal reflexes. No cranial nerve deficit.  Skin: No rash noted.  Psychiatric: She has a normal mood and affect. Her behavior is normal. Judgment and thought content normal.          Assessment & Plan:  Physical exam. Patient set up Pap smear as above. Obtain  screening lab work. Set up 24-hour ambulatory blood pressure monitor. In the meantime, increase amlodipine to 5 mg once daily  Eulas Post MD [facility]

## 2015-08-27 NOTE — Patient Instructions (Signed)
Let me know if you have not heard back about 24 hour BP monitor in one week. Go ahead and increase the Amlodipine to  5 mg once daily.

## 2015-08-29 ENCOUNTER — Encounter: Payer: Self-pay | Admitting: Family Medicine

## 2015-09-07 ENCOUNTER — Telehealth: Payer: Self-pay | Admitting: Family Medicine

## 2015-09-07 MED ORDER — AMLODIPINE BESYLATE 5 MG PO TABS: 5.0000 mg | ORAL_TABLET | Freq: Every day | ORAL | Status: DC

## 2015-09-07 NOTE — Telephone Encounter (Signed)
Refill sent to pharmacy.   

## 2015-09-07 NOTE — Telephone Encounter (Signed)
Pharm needs new amlodipine 2.5 mg #90 w/refills

## 2015-09-21 ENCOUNTER — Encounter: Payer: 59 | Admitting: Cardiology

## 2015-09-23 ENCOUNTER — Encounter: Payer: 59 | Admitting: Cardiology

## 2015-09-24 ENCOUNTER — Encounter: Payer: Self-pay | Admitting: Cardiology

## 2015-09-28 ENCOUNTER — Encounter: Payer: Self-pay | Admitting: Cardiology

## 2015-09-28 ENCOUNTER — Ambulatory Visit (INDEPENDENT_AMBULATORY_CARE_PROVIDER_SITE_OTHER): Payer: 59 | Admitting: Cardiology

## 2015-09-28 VITALS — BP 152/87 | HR 71 | Ht 64.0 in | Wt 113.0 lb

## 2015-09-28 DIAGNOSIS — I10 Essential (primary) hypertension: Secondary | ICD-10-CM | POA: Diagnosis not present

## 2015-09-28 DIAGNOSIS — R079 Chest pain, unspecified: Secondary | ICD-10-CM | POA: Diagnosis not present

## 2015-09-28 DIAGNOSIS — I351 Nonrheumatic aortic (valve) insufficiency: Secondary | ICD-10-CM | POA: Diagnosis not present

## 2015-09-28 NOTE — Patient Instructions (Signed)
Will schedule at [place] [facility] has requested that you have an echocardiogram. Echocardiography is a painless test that uses sound waves to create images of your heart. It provides your doctor with information about the size and shape of your heart and how well your heart's chambers and valves are working. This procedure takes approximately one hour. There are no restrictions for this procedure.   SCHEDULE AT [place] Your physician has requested that you have an exercise tolerance test. For further information please visit [email or web address]. Please also follow instruction sheet, as given.   NO CHANGES WITH CURRENT MEDICATIONS   Your physician recommends that you schedule a follow-up appointment in 1-2 MONTHS WITH DR HARDING- FOLLOW UP RESULTS

## 2015-09-28 NOTE — Progress Notes (Signed)
PCP: Eulas Post, MD  Clinic Note: Chief Complaint  Patient presents with  . Hospitalization Follow-up    ER visit for CP,chest pain awken from sleep x1,no sob ,no sweling  -Cardiology Consultation for Chest Pain And Murmur  HPI: Julie Savage is a 65 y.o. female with a PMH below - hypertension and anxiety-- who presents today for emergency room follow-up presenting with chest pain on May 13.   Julie Savage went to the emergency room on 08/21/2015 with a complaint of chest pain that woke her up from sleep. Did not radiate. Pain was sharp. She reports waking up at 3 AM with shaking, and initially thought this anxiety, but she had mid substernal chest pain. The pain waxed and waned from 4-6/10 but did not go away. She does acknowledge having a history of anxiety, but never chest pain She denied dyspnea, diaphoresis or pain radiation. No nausea vomiting associated. No swelling or PND/orthopnea.  Recent Hospitalizations: She was discharged emergency room after ruling out for MI.  Studies Reviewed: EKG hospital was relatively benign  Interval History: Lives with his today for follow-up. She recalls not having any further episodes of chest pain since that emergency room visit. This was a long-time she's had central left-sided chest pain. In the past she's had some twinges of pain a little bit more lateral than midline that is usually worse with deep inspiration or coughing. She denies any PND, orthopnea or edema. She does wake up sometimes the middle night with anxiety, does not note true PND symptoms. The chest discomfort was not made worse with walking. She does note having episodes of rapid heartbeats lasting maybe anywhere from 20-30 minutes. Denies any lightheadedness or dizziness associated with this. She also says that she has not had any of these symptoms for the last month or 2. She had a lot more in the beginning of the year.  No further episodes of CP. She is usually very  active doing exercise, has been too busy since her ER visit to do her usual exercise. I think she may be a little bit anxious since her chest pain episode. Since ER visit, no chest pain or shortness of breath with rest or exertion.  No PND, orthopnea or edema. No lightheadedness, dizziness, weakness or syncope/near syncope. No TIA/amaurosis fugax symptoms. No melena, hematochezia, hematuria, or epstaxis. No claudication.  ROS: A comprehensive was performed. Review of Systems  Constitutional: Negative for fever, chills and malaise/fatigue.  HENT: Negative for congestion and nosebleeds.   Eyes: Negative for blurred vision.  Respiratory: Negative for cough, shortness of breath and wheezing.   Cardiovascular:       Per history of present illness  Gastrointestinal: Negative for heartburn, abdominal pain, blood in stool and melena.  Genitourinary: Negative for hematuria.  Musculoskeletal: Positive for joint pain (Knees hurt  with long walks ). Negative for myalgias.  Neurological: Negative for dizziness, focal weakness, loss of consciousness and headaches.  Psychiatric/Behavioral:       In the past, she had an episode of transient amnesia, but nothing recently.  All other systems reviewed and are negative.   Past Medical History  Diagnosis Date  . Essential hypertension 10/27/2008  . AMNESIA, TRANSIENT GLOBAL 11/24/2008  . ASTHMA 10/27/2008  . OSTEOPOROSIS 10/27/2008    Past Surgical History  Procedure Laterality Date  . Dental implants    . Colonoscopy    . Transthoracic echocardiogram  11/2008    Normal LV function. Gr 1 DD. Mild MR.  Peak PAP ~35 mmHg  . Nm myoview ltd  05/2003    NORMAL STUDY.  EF ~69%. NO ISCHEMIA OR INFARCTION    Prior to Admission medications   Medication Sig Start Date End Date Taking? Authorizing Provider  amLODipine (NORVASC) 5 MG tablet Take 1 tablet (5 mg total) by mouth daily. 09/07/15  Yes Eulas Post, MD  Calcium Carbonate-Vitamin D  (CALCIUM-VITAMIN D) 500-200 MG-UNIT per tablet Take 1 tablet by mouth 2 (two) times daily with a meal.     Yes Historical Provider, MD  fish oil-omega-3 fatty acids 1000 MG capsule Take 1 g by mouth daily. One tab every other day   Yes Historical Provider, MD  Multiple Vitamins-Minerals (CENTRUM SILVER PO) Take 1 tablet by mouth daily.    Yes Historical Provider, MD    Allergies  Allergen Reactions  . Aspirin Rash  . Penicillins Rash     Social History   Social History  . Marital Status: Married    Spouse Name: Sonia Side  . Number of Children: 3  . Years of Education: 12   Occupational History  . Retired     Electronics engineer   Social History Main Topics  . Smoking status: Never Smoker   . Smokeless tobacco: Never Used  . Alcohol Use: Yes     Comment: occasionally  . Drug Use: No  . Sexual Activity: Not Asked   Other Topics Concern  . None   Social History Narrative   Married mother of 3, grandmother of 2. Lives with her husband and one of her sons. She is a retired Electronics engineer. Has a high school education.   Never smoked. Maybe drinks 2 glasses of wine a week.   She routinely exercises about 3 days a week for 60 minutes doing walking.   Family History  Problem Relation Age of Onset  . Heart disease Mother 90    CVA, she is otherwise alive and well at age 40  . Cancer Sister     breast  . Diabetes Brother     type ll  . Colon cancer Brother 67  . Lung cancer Father 7    Deceased  . Hypertension Brother     Third brother  . Diabetes Brother     DM type II, second brother  . Hypertension Sister     Third of 4 sisters  . Hyperlipidemia Sister     Fourth of 4 sisters    Wt Readings from Last 3 Encounters:  09/28/15 113 lb (51.256 kg)  08/27/15 111 lb 1.6 oz (50.395 kg)  08/21/15 110 lb (49.896 kg)    PHYSICAL EXAM BP 152/87 mmHg  Pulse 71  Ht 5\' 4"  (1.626 m)  Wt 113 lb (51.256 kg)  BMI 19.39 kg/m2 - BP normal @ home General appearance:  alert, cooperative, appears stated age, no distress and Otherwise healthy-appearing. Well-nourished well-groomed. Pleasant mood and affect. Neck: no adenopathy, no carotid bruit and no JVD; suspect radiated aortic murmur bilateral carotids Lungs: clear to auscultation bilaterally, normal percussion bilaterally and non-labored Heart: regular rate and rhythm, S1 and S2 normal, no click, rub or gallop ; 2/6C-D SEM at RUSB Abdomen: soft, non-tender; bowel sounds normal; no masses,  no organomegaly; no HJR Extremities: extremities normal, atraumatic, no cyanosis, or edema  Pulses: 2+ and symmetric;  Skin: mobility and turgor normal, no edema, no evidence of bleeding or bruising and no lesions noted  Neurologic: Mental status: Alert, oriented, thought content appropriate Cranial nerves: normal (  II-XII grossly intact)    Adult ECG Report Not done   Other studies Reviewed: Additional studies/ records that were reviewed today include:  Recent Labs:  Lab Results  Component Value Date   CHOL 174 08/27/2015   HDL 71.30 08/27/2015   LDLCALC 95 08/27/2015   TRIG 39.0 08/27/2015   CHOLHDL 2 08/27/2015    ASSESSMENT / PLAN: Problem List Items Addressed This Visit    Essential hypertension - Primary (Chronic)    Only on 5 mg of amlodipine, apparently has "white coat syndrome hypertension as her blood pressures usually run well within the normal range at home. Pending results of her evaluation, may or may not recommend further titration of medications. Can see her blood pressure response exercise.      Relevant Orders   EXERCISE TOLERANCE TEST   ECHOCARDIOGRAM COMPLETE   Chest pain with low risk for cardiac etiology    1 episode of chest pain awakening her from sleep. No further symptoms are similar to this. However she really has not done a lot of physical activity besides routine work since then. In order to exclude ischemic heart disease, will check GXT. As much for peace of mind.       Relevant Orders   EXERCISE TOLERANCE TEST   ECHOCARDIOGRAM COMPLETE   Aortic ejection murmur    Murmur sounds like an aortic systolic ejection murmur, back and also heard at the apex. To determine the true etiology of her murmur since it is associated chest pain, will check a 2-D echocardiogram. If necessary, we may need to serially follow echocardiograms, if benign, we can simply return to PCP.      Relevant Orders   EXERCISE TOLERANCE TEST   ECHOCARDIOGRAM COMPLETE      Current medicines are reviewed at length with the patient today. (+/- concerns) Notes white coat syndrome. Pressures usually normal at home The following changes have been made: None  Studies Ordered:   Orders Placed This Encounter  Procedures  . EXERCISE TOLERANCE TEST  . ECHOCARDIOGRAM COMPLETE   ROV ~1-2 months    Glenetta Hew, M.D., M.S. Interventional Cardiologist   Pager # [phone] Phone # [phone] [address] [place], [place] [address]

## 2015-09-28 NOTE — Assessment & Plan Note (Signed)
Only on 5 mg of amlodipine, apparently has "white coat syndrome hypertension as her blood pressures usually run well within the normal range at home. Pending results of her evaluation, may or may not recommend further titration of medications. Can see her blood pressure response exercise.

## 2015-09-28 NOTE — Assessment & Plan Note (Signed)
1 episode of chest pain awakening her from sleep. No further symptoms are similar to this. However she really has not done a lot of physical activity besides routine work since then. In order to exclude ischemic heart disease, will check GXT. As much for peace of mind.

## 2015-09-28 NOTE — Assessment & Plan Note (Signed)
Murmur sounds like an aortic systolic ejection murmur, back and also heard at the apex. To determine the true etiology of her murmur since it is associated chest pain, will check a 2-D echocardiogram. If necessary, we may need to serially follow echocardiograms, if benign, we can simply return to PCP.

## 2015-10-01 ENCOUNTER — Encounter (HOSPITAL_COMMUNITY): Payer: 59

## 2015-10-07 ENCOUNTER — Ambulatory Visit (HOSPITAL_COMMUNITY)
Admission: RE | Admit: 2015-10-07 | Discharge: 2015-10-07 | Disposition: A | Discharge status: Home / Self Care | Payer: 59 | Source: Ambulatory Visit | Attending: Cardiology | Admitting: Cardiology

## 2015-10-07 DIAGNOSIS — R079 Chest pain, unspecified: Secondary | ICD-10-CM

## 2015-10-07 DIAGNOSIS — I10 Essential (primary) hypertension: Secondary | ICD-10-CM | POA: Diagnosis not present

## 2015-10-07 DIAGNOSIS — I351 Nonrheumatic aortic (valve) insufficiency: Secondary | ICD-10-CM | POA: Diagnosis not present

## 2015-10-08 ENCOUNTER — Telehealth: Payer: Self-pay | Admitting: *Deleted

## 2015-10-08 LAB — EXERCISE TOLERANCE TEST
CHL CUP RESTING HR STRESS: 76 {beats}/min
CSEPEDS: 30 s
CSEPEW: 7 METS
CSEPHR: 83 %
Exercise duration (min): 5 min
MPHR: 156 {beats}/min
Peak HR: 130 {beats}/min

## 2015-10-08 NOTE — Telephone Encounter (Signed)
LEFT MESSAGE TO CALL BACK - RESULTS OF GXT RELEASE TO  [facility]

## 2015-10-08 NOTE — Telephone Encounter (Signed)
-----   Message from Leonie Man, MD sent at 10/08/2015  2:17 PM EDT ----- Good news - negative Treadmill Stress Test.  No EKG evidence to suggest heart artery blockage.  Glenetta Hew, MD

## 2015-10-18 ENCOUNTER — Ambulatory Visit (HOSPITAL_COMMUNITY): Payer: 59 | Attending: Cardiology

## 2015-10-18 ENCOUNTER — Other Ambulatory Visit: Payer: Self-pay

## 2015-10-18 DIAGNOSIS — I10 Essential (primary) hypertension: Secondary | ICD-10-CM | POA: Diagnosis not present

## 2015-10-18 DIAGNOSIS — R079 Chest pain, unspecified: Secondary | ICD-10-CM

## 2015-10-18 DIAGNOSIS — I351 Nonrheumatic aortic (valve) insufficiency: Secondary | ICD-10-CM | POA: Diagnosis not present

## 2015-10-18 DIAGNOSIS — I340 Nonrheumatic mitral (valve) insufficiency: Secondary | ICD-10-CM | POA: Diagnosis not present

## 2015-10-18 DIAGNOSIS — I358 Other nonrheumatic aortic valve disorders: Secondary | ICD-10-CM | POA: Insufficient documentation

## 2015-10-18 DIAGNOSIS — I119 Hypertensive heart disease without heart failure: Secondary | ICD-10-CM | POA: Diagnosis not present

## 2015-10-18 DIAGNOSIS — I071 Rheumatic tricuspid insufficiency: Secondary | ICD-10-CM | POA: Insufficient documentation

## 2015-11-02 NOTE — Telephone Encounter (Signed)
See result note.  

## 2015-11-15 ENCOUNTER — Ambulatory Visit (INDEPENDENT_AMBULATORY_CARE_PROVIDER_SITE_OTHER): Payer: 59 | Admitting: Family Medicine

## 2015-11-15 VITALS — BP 130/80 | HR 75 | Temp 98.2°F | Ht 64.0 in | Wt 111.9 lb

## 2015-11-15 DIAGNOSIS — L820 Inflamed seborrheic keratosis: Secondary | ICD-10-CM

## 2015-11-15 DIAGNOSIS — L819 Disorder of pigmentation, unspecified: Secondary | ICD-10-CM

## 2015-11-15 NOTE — Progress Notes (Signed)
Subjective:     Patient ID: Julie Savage, female   DOB: [birth year], 65 y.o.   MRN: [id number]  HPI Patient seen with darkened pigmented skin lesion right upper back. Sometimes itches. Growing in size. She is concerned because of the color. No prior history of skin cancer. No bleeding.  Past Medical History:  Diagnosis Date  . AMNESIA, TRANSIENT GLOBAL 11/24/2008  . ASTHMA 10/27/2008  . Essential hypertension 10/27/2008  . OSTEOPOROSIS 10/27/2008   Past Surgical History:  Procedure Laterality Date  . COLONOSCOPY    . dental implants    . NM MYOVIEW LTD  05/2003   NORMAL STUDY.  EF ~69%. NO ISCHEMIA OR INFARCTION  . TRANSTHORACIC ECHOCARDIOGRAM  11/2008   Normal LV function. Gr 1 DD. Mild MR. Peak PAP ~35 mmHg    reports that she has never smoked. She has never used smokeless tobacco. She reports that she drinks alcohol. She reports that she does not use drugs. family history includes Cancer in her sister; Colon cancer (age of onset: 37) in her brother; Diabetes in her brother and brother; Heart disease (age of onset: 32) in her mother; Hyperlipidemia in her sister; Hypertension in her brother and sister; Lung cancer (age of onset: 48) in her father. Allergies  Allergen Reactions  . Aspirin Rash  . Penicillins Rash     Review of Systems  Hematological: Negative for adenopathy.       Objective:   Physical Exam  Constitutional: She appears well-developed and well-nourished.  Cardiovascular: Normal rate and regular rhythm.   Pulmonary/Chest: Effort normal and breath sounds normal. No respiratory distress. She has no wheezes. She has no rales.  Skin:  Patient has raised dark and brown and skin lesion with slightly irregular margin and slightly scaly surface right upper back. This is approximately 7 mm in diameter.       Assessment:     Pigmented skin lesion right upper back. Suspect seborrheic keratosis     Plan:     Discussed risk and benefits of shave excision and  patient consented -Prepped skin with alcohol. Anesthesia with 1% Xylocaine with epinephrine. Shave excision with #15 blade. Minimal bleeding. Controlled with silver nitrate swab Topical antibiotic and dressing applied -Wound care instruction given -Specimen sent to [facility]  Eulas Post MD [facility]

## 2015-11-15 NOTE — Patient Instructions (Signed)
Keep wound dry for the first 24 hours then clean daily with soap and water for one week. Apply topical antibiotic daily for 3-4 days. Keep covered with clean dressing for 4-5 days. Follow up promptly for any signs of infection such as redness, warmth, pain, or drainage.  

## 2015-11-15 NOTE — Progress Notes (Signed)
Pre visit review using our clinic review tool, if applicable. No additional management support is needed unless otherwise documented below in the visit note. 

## 2015-11-16 ENCOUNTER — Encounter: Payer: Self-pay | Admitting: Family Medicine

## 2015-12-08 ENCOUNTER — Encounter: Payer: Self-pay | Admitting: Cardiology

## 2015-12-08 ENCOUNTER — Ambulatory Visit (INDEPENDENT_AMBULATORY_CARE_PROVIDER_SITE_OTHER): Payer: 59 | Admitting: Cardiology

## 2015-12-08 VITALS — BP 149/92 | HR 59 | Ht 64.0 in | Wt 110.0 lb

## 2015-12-08 DIAGNOSIS — I359 Nonrheumatic aortic valve disorder, unspecified: Secondary | ICD-10-CM | POA: Diagnosis not present

## 2015-12-08 DIAGNOSIS — I10 Essential (primary) hypertension: Secondary | ICD-10-CM | POA: Diagnosis not present

## 2015-12-08 DIAGNOSIS — R002 Palpitations: Secondary | ICD-10-CM

## 2015-12-08 DIAGNOSIS — R079 Chest pain, unspecified: Secondary | ICD-10-CM | POA: Diagnosis not present

## 2015-12-08 DIAGNOSIS — I351 Nonrheumatic aortic (valve) insufficiency: Secondary | ICD-10-CM

## 2015-12-08 NOTE — Patient Instructions (Signed)
NO CHANGE WITH CURRENT MEDICATIONS  SCHEDULE ECHO IN 2 [id number]) Your physician has requested that you have an echocardiogram at [facility] 2019. Echocardiography is a painless test that uses sound waves to create images of your heart. It provides your doctor with information about the size and shape of your heart and how well your heart's chambers and valves are working. This procedure takes approximately one hour. There are no restrictions for this procedure.  Your physician wants you to follow-up in 24 MONTHS ( AUG 2019) WITH DR HARDING- 30 MIN  You will receive a reminder letter in the mail two months in advance. If you don't receive a letter, please call our office to schedule the follow-up appointment.   If you need a refill on your cardiac medications before your next appointment, please call your pharmacy.

## 2015-12-08 NOTE — Progress Notes (Signed)
PCP: Eulas Post, MD  Clinic Note: Chief Complaint  Patient presents with  . Follow-up    sob when exerting self. Pt wants to see about wearing a montior.   -Cardiology Consultation for Chest Pain And Murmur  HPI: Julie Savage is a 65 y.o. female with a PMH below - hypertension and anxiety-- who presents today for emergency room follow-up presenting with chest pain on May 13.   Julie Savage went to the emergency room on 08/21/2015 with a complaint of chest pain that woke her up from sleep. Did not radiate. Pain was sharp. She reports waking up at 3 AM with shaking, and initially thought this anxiety, but she had mid substernal chest pain. The pain waxed and waned from 4-6/10 but did not go away. She does acknowledge having a history of anxiety, but never chest pain She denied dyspnea, diaphoresis or pain radiation. No nausea vomiting associated. No swelling or PND/orthopnea.  Recent Hospitalizations: She was discharged emergency room after ruling out for MI.  Studies Reviewed:   Echo: 10/2015 Left ventricle: The cavity size was normal. There was mild focal basal hypertrophy of the septum. Systolic function was normal.   The estimated ejection fraction was in the range of 60% to 65%.   Wall motion was normal; there were no regional wall motion abnormalities. The transmitral flow pattern was normal. Left   ventricular diastolic function parameters were normal. - Aortic valve: Severe diffuse calcification involving the noncoronary cusp. - Mitral valve: There was mild regurgitation. - Left atrium: The atrium was mildly dilated. - Tricuspid valve: There was mild regurgitation.  GXT:  Overall a low-risk exercise stress test. Hypertensive response. Exercised 5 min   Interval History: Julie Savage presents today here to follow-up after her studies. She still has a little exertional dyspnea if walking uphill, but she is easily able to do more than her husband is able to do. She is  a bit concerned after hearing that her sister just had a pacemaker placed. She was having symptoms are somewhat similar to her initial symptoms, she has not really had any palpitations, nor any near syncope or syncopal symptoms. She is probably not having any arrhythmias. He really hasn't noticed any significant rapid heartbeat episodes since I last saw her. If she doesn't think we may talk about having her wear a monitor. Has not really had any since the beginning of the year. No further chest discomfort episodes with rest or exertion  She denies any PND, orthopnea or edema. She does wake up sometimes the middle night with anxiety, does not note true PND symptoms. The chest discomfort was not made worse with walking. Denies any lightheadedness or dizziness.   No further episodes of CP. She is usually very active doing exercise, has been too busy since her ER visit to do her usual exercise. I think she may be a little bit anxious since her chest pain episode.  No syncope/near syncope, or TIA/amaurosis fugax symptoms. No melena, hematochezia, hematuria, or epstaxis. No claudication.  ROS: A comprehensive was performed. Review of Systems  Constitutional: Negative for chills, fever and malaise/fatigue.  HENT: Negative for congestion and nosebleeds.   Eyes: Negative for blurred vision.  Respiratory: Negative for cough, shortness of breath and wheezing.   Cardiovascular:       Per history of present illness  Gastrointestinal: Negative for abdominal pain, blood in stool, heartburn and melena.  Genitourinary: Negative for hematuria.  Musculoskeletal: Positive for joint pain (Knees hurt  with long walks ). Negative for myalgias.  Neurological: Negative for dizziness, focal weakness, loss of consciousness and headaches.  Psychiatric/Behavioral:       In the past, she had an episode of transient amnesia, but nothing recently.  All other systems reviewed and are negative.   Past Medical History:    Diagnosis Date  . AMNESIA, TRANSIENT GLOBAL 11/24/2008  . ASTHMA 10/27/2008  . Essential hypertension 10/27/2008  . OSTEOPOROSIS 10/27/2008    Past Surgical History:  Procedure Laterality Date  . COLONOSCOPY    . dental implants    . NM MYOVIEW LTD  05/2003   NORMAL STUDY.  EF ~69%. NO ISCHEMIA OR INFARCTION  . TRANSTHORACIC ECHOCARDIOGRAM  11/2008   Normal LV function. Gr 1 DD. Mild MR. Peak PAP ~35 mmHg    Prior to Admission medications   Medication Sig Start Date End Date Taking? Authorizing Provider  amLODipine (NORVASC) 5 MG tablet Take 1 tablet (5 mg total) by mouth daily. 09/07/15  Yes Eulas Post, MD  Calcium Carbonate-Vitamin D (CALCIUM-VITAMIN D) 500-200 MG-UNIT per tablet Take 1 tablet by mouth 2 (two) times daily with a meal.     Yes Historical Provider, MD  fish oil-omega-3 fatty acids 1000 MG capsule Take 1 g by mouth daily. One tab every other day   Yes Historical Provider, MD  Multiple Vitamins-Minerals (CENTRUM SILVER PO) Take 1 tablet by mouth daily.    Yes Historical Provider, MD    Allergies  Allergen Reactions  . Aspirin Rash  . Penicillins Rash     Social History   Social History  . Marital status: Married    Spouse name: Julie Savage  . Number of children: 3  . Years of education: 12   Occupational History  . Retired     Electronics engineer   Social History Main Topics  . Smoking status: Never Smoker  . Smokeless tobacco: Never Used  . Alcohol use Yes     Comment: occasionally  . Drug use: No  . Sexual activity: Not Asked   Other Topics Concern  . None   Social History Narrative   Married mother of 3, grandmother of 2. Lives with her husband and one of her sons. She is a retired Electronics engineer. Has a high school education.   Never smoked. Maybe drinks 2 glasses of wine a week.   She routinely exercises about 3 days a week for 60 minutes doing walking.    Family History  Problem Relation Age of Onset  . Diabetes Brother     type ll   . Colon cancer Brother 64  . Heart disease Mother 33    CVA, she is otherwise alive and well at age 47  . Lung cancer Father 31    Deceased  . Cancer Sister     breast  . Hypertension Brother     Third brother  . Diabetes Brother     DM type II, second brother  . Hypertension Sister     Third of 4 sisters  . Hyperlipidemia Sister     Fourth of 4 sisters    Wt Readings from Last 3 Encounters:  12/08/15 110 lb (49.9 kg)  11/15/15 111 lb 14.4 oz (50.8 kg)  09/28/15 113 lb (51.3 kg)    PHYSICAL EXAM BP (!) 149/92   Pulse (!) 59   Ht 5\' 4"  (1.626 m)   Wt 110 lb (49.9 kg)   BMI 18.88 kg/m  - BP normal @ home  usually ~110s-120s/70s General appearance: alert, cooperative, appears stated age, no distress and Otherwise healthy-appearing. Well-nourished well-groomed. Pleasant mood and affect.Very quiet. Her husband does most of talking. Neck: no adenopathy, no carotid bruit and no JVD; suspect radiated aortic murmur bilateral carotids Lungs: clear to auscultation bilaterally, normal percussion bilaterally and non-labored Heart: regular rate and rhythm, S1 and S2 normal, no click, rub or gallop ; 1-2/6C-D SEM at RUSB Abdomen: soft, non-tender; bowel sounds normal; no masses,  no organomegaly; no HJR Extremities: extremities normal, atraumatic, no cyanosis, or edema  Pulses: 2+ and symmetric;  Skin: mobility and turgor normal, no edema, no evidence of bleeding or bruising and no lesions noted  Neurologic: Mental status: Alert, oriented, thought content appropriate    Adult ECG Report Not done   Other studies Reviewed: Additional studies/ records that were reviewed today include:  Recent Labs:  Lab Results  Component Value Date   CHOL 174 08/27/2015   HDL 71.30 08/27/2015   LDLCALC 95 08/27/2015   TRIG 39.0 08/27/2015   CHOLHDL 2 08/27/2015    ASSESSMENT / PLAN: Problem List Items Addressed This Visit    Heart palpitations    She had a lot of episodes earlier this  year, but not really recently. She was concerned because of her sisters need for a pacemaker placement. I told her that if she were to have recurrence of symptoms that we may capture, with and or a monitor will be worthwhile, otherwise a pro-would not recommend wearing a short-term monitor which may not capture anything. Only other becoming more frequent. If she were to have near syncope or syncope Symptoms, then we can consider loop recorder.      Essential hypertension (Chronic)    She again has elevated blood pressures here today, but tells me at home her pressures are usually in the 130/70 range. For now we'll continue to monitor, but if she is still noticing symptoms exertional dyspnea, I would recommend increasing her antihypertensive therapy. Response to exercise probably just has a tendency to be hypertensive. Would probably consider ACE inhibitor/ARB or diuretic as the next therapy of choice. .      Relevant Orders   ECHOCARDIOGRAM COMPLETE   Chest pain with low risk for cardiac etiology    No further episodes of chest discomfort. Negative GXT. She does have a little exertional dyspnea, but is easily able to walk routinely. Only notes it when up walking up hill. She did have some evidence of deconditioning having only been able to go 5 minutes on the treadmill with a hypertensive response. This would indicate that she probably needs more aggressive treatment of her hypertension. Normal echo. Didn't really suggest there is much the way of any diastolic dysfunction however.      Aortic ejection murmur - Primary    She did have some calcification of her non-coronary cusp, no evidence of any stenosis. Simply sclerosis. Would probably want to recheck an echo in a couple years to reassess her aortic valve calcification. Usually the noncoronary cusp is probably related to early trauma, but cannot exclude correlation with atherosclerotic disease.      Relevant Orders   ECHOCARDIOGRAM COMPLETE     Other Visit Diagnoses    Aortic valvular disorder       Relevant Orders   ECHOCARDIOGRAM COMPLETE      Current medicines are reviewed at length with the patient today. (+/- concerns) Notes white coat syndrome. Pressures usually normal at home The following changes have been made: None  NO CHANGE WITH CURRENT MEDICATIONS  SCHEDULE ECHO IN 2 [id number]) Your physician has requested that you have an echocardiogram at [facility] 2019 Studies Ordered:   Orders Placed This Encounter  Procedures  . ECHOCARDIOGRAM COMPLETE   ROV ~2 yrs    Glenetta Hew, M.D., M.S. Interventional Cardiologist   Pager # [phone] Phone # [phone] [address] [place], [place] [address]

## 2015-12-10 ENCOUNTER — Encounter: Payer: Self-pay | Admitting: Cardiology

## 2015-12-10 DIAGNOSIS — R002 Palpitations: Secondary | ICD-10-CM | POA: Insufficient documentation

## 2015-12-10 NOTE — Assessment & Plan Note (Signed)
She had a lot of episodes earlier this year, but not really recently. She was concerned because of her sisters need for a pacemaker placement. I told her that if she were to have recurrence of symptoms that we may capture, with and or a monitor will be worthwhile, otherwise a pro-would not recommend wearing a short-term monitor which may not capture anything. Only other becoming more frequent. If she were to have near syncope or syncope Symptoms, then we can consider loop recorder.

## 2015-12-10 NOTE — Assessment & Plan Note (Signed)
She did have some calcification of her non-coronary cusp, no evidence of any stenosis. Simply sclerosis. Would probably want to recheck an echo in a couple years to reassess her aortic valve calcification. Usually the noncoronary cusp is probably related to early trauma, but cannot exclude correlation with atherosclerotic disease.

## 2015-12-10 NOTE — Assessment & Plan Note (Addendum)
She again has elevated blood pressures here today, but tells me at home her pressures are usually in the 130/70 range. For now we'll continue to monitor, but if she is still noticing symptoms exertional dyspnea, I would recommend increasing her antihypertensive therapy. Response to exercise probably just has a tendency to be hypertensive. Would probably consider ACE inhibitor/ARB or diuretic as the next therapy of choice. Julie Savage

## 2015-12-10 NOTE — Assessment & Plan Note (Signed)
No further episodes of chest discomfort. Negative GXT. She does have a little exertional dyspnea, but is easily able to walk routinely. Only notes it when up walking up hill. She did have some evidence of deconditioning having only been able to go 5 minutes on the treadmill with a hypertensive response. This would indicate that she probably needs more aggressive treatment of her hypertension. Normal echo. Didn't really suggest there is much the way of any diastolic dysfunction however.

## 2016-01-18 ENCOUNTER — Ambulatory Visit (INDEPENDENT_AMBULATORY_CARE_PROVIDER_SITE_OTHER): Payer: 59 | Admitting: Family Medicine

## 2016-01-18 DIAGNOSIS — Z23 Encounter for immunization: Secondary | ICD-10-CM

## 2016-03-28 ENCOUNTER — Ambulatory Visit (INDEPENDENT_AMBULATORY_CARE_PROVIDER_SITE_OTHER): Payer: 59 | Admitting: Family Medicine

## 2016-03-28 VITALS — BP 130/80 | HR 78 | Temp 97.8°F | Ht 64.0 in | Wt 111.3 lb

## 2016-03-28 DIAGNOSIS — L819 Disorder of pigmentation, unspecified: Secondary | ICD-10-CM | POA: Diagnosis not present

## 2016-03-28 NOTE — Progress Notes (Signed)
Subjective:     Patient ID: Julie Savage, female   DOB: Oct 23, 1950, 65 y.o.   MRN: [id number]  HPI Patient seen with some mild skin hyperpigmentation at site of seborrheic keratosis excision last August. She has some itching from time to time. This was taken off last August by shave excision and pathology confirmed irritated seborrheic keratosis. She has not noted any signs of regrowth. No pain.  Past Medical History:  Diagnosis Date  . AMNESIA, TRANSIENT GLOBAL 11/24/2008  . ASTHMA 10/27/2008  . Essential hypertension 10/27/2008  . OSTEOPOROSIS 10/27/2008   Past Surgical History:  Procedure Laterality Date  . COLONOSCOPY    . dental implants    . NM MYOVIEW LTD  05/2003   NORMAL STUDY.  EF ~69%. NO ISCHEMIA OR INFARCTION  . TRANSTHORACIC ECHOCARDIOGRAM  11/2008   Normal LV function. Gr 1 DD. Mild MR. Peak PAP ~35 mmHg    reports that she has never smoked. She has never used smokeless tobacco. She reports that she drinks alcohol. She reports that she does not use drugs. family history includes Cancer in her sister; Colon cancer (age of onset: 19) in her brother; Diabetes in her brother and brother; Heart disease (age of onset: 33) in her mother; Hyperlipidemia in her sister; Hypertension in her brother and sister; Lung cancer (age of onset: 79) in her father. Allergies  Allergen Reactions  . Aspirin Rash  . Penicillins Rash     Review of Systems  Skin: Negative for rash.  Hematological: Negative for adenopathy.       Objective:   Physical Exam  Constitutional: She appears well-developed and well-nourished.  Cardiovascular: Normal rate and regular rhythm.   Skin:  Patient has some mild hyperpigmentation at previous site of biopsy of seborrheic keratosis right upper back. There is no signs of recurrence of skin growth. She has some mild hypertrophic scar tissue       Assessment:     Mild hyperpigmentation related to previous biopsy of seborrheic keratosis. No signs of  regrowth    Plan:     -Reassurance given -Avoid scratching as much as possible -Follow-up for any signs of regrowth  Eulas Post MD [facility]

## 2016-03-28 NOTE — Progress Notes (Signed)
Pre visit review using our clinic review tool, if applicable. No additional management support is needed unless otherwise documented below in the visit note. 

## 2016-04-13 ENCOUNTER — Encounter: Payer: Self-pay | Admitting: Family Medicine

## 2016-04-13 ENCOUNTER — Ambulatory Visit (INDEPENDENT_AMBULATORY_CARE_PROVIDER_SITE_OTHER): Payer: BLUE CROSS/BLUE SHIELD | Admitting: Family Medicine

## 2016-04-13 ENCOUNTER — Ambulatory Visit (INDEPENDENT_AMBULATORY_CARE_PROVIDER_SITE_OTHER)
Admission: RE | Admit: 2016-04-13 | Discharge: 2016-04-13 | Disposition: A | Discharge status: Home / Self Care | Payer: BLUE CROSS/BLUE SHIELD | Source: Ambulatory Visit | Attending: Family Medicine | Admitting: Family Medicine

## 2016-04-13 VITALS — BP 122/90 | HR 64 | Temp 98.3°F | Ht 64.0 in | Wt 113.2 lb

## 2016-04-13 DIAGNOSIS — R69 Illness, unspecified: Secondary | ICD-10-CM

## 2016-04-13 DIAGNOSIS — J111 Influenza due to unidentified influenza virus with other respiratory manifestations: Secondary | ICD-10-CM

## 2016-04-13 MED ORDER — BENZONATATE 100 MG PO CAPS: 100.0000 mg | ORAL_CAPSULE | Freq: Three times a day (TID) | ORAL | 0 refills | Status: DC | PRN

## 2016-04-13 MED ORDER — ONDANSETRON HCL 4 MG PO TABS: 4.0000 mg | ORAL_TABLET | Freq: Three times a day (TID) | ORAL | 0 refills | Status: DC | PRN

## 2016-04-13 NOTE — Progress Notes (Signed)
Pre visit review using our clinic review tool, if applicable. No additional management support is needed unless otherwise documented below in the visit note. 

## 2016-04-13 NOTE — Patient Instructions (Signed)
BEFORE YOU LEAVE: -rapid flu test -xray sheet -follow up: as needed  I hope you feel better soon.  Go get the chest xray.  Please drink plenty of fluids.  Imodium if further diarrhea.  Can use zofran if needed per instructions for nausea and vomiting.  Can use tessalon for cough.  Seek care if worsening, new concerns or you not improving over the next few days.   Influenza, Adult Influenza ("the flu") is an infection in the lungs, nose, and throat (respiratory tract). It is caused by a virus. The flu causes many common cold symptoms, as well as a high fever and body aches. It can make you feel very sick. The flu spreads easily from person to person (is contagious). Getting a flu shot (influenza vaccination) every year is the best way to prevent the flu. Follow these instructions at home:  Take over-the-counter and prescription medicines only as told by your doctor.  Use a cool mist humidifier to add moisture (humidity) to the air in your home. This can make it easier to breathe.  Rest as needed.  Drink enough fluid to keep your pee (urine) clear or pale yellow.  Cover your mouth and nose when you cough or sneeze.  Wash your hands with soap and water often, especially after you cough or sneeze. If you cannot use soap and water, use hand sanitizer.  Stay home from work or school as told by your doctor. Unless you are visiting your doctor, try to avoid leaving home until your fever has been gone for 24 hours without the use of medicine.  Keep all follow-up visits as told by your doctor. This is important. How is this prevented?  Getting a yearly (annual) flu shot is the best way to avoid getting the flu. You may get the flu shot in late summer, fall, or winter. Ask your doctor when you should get your flu shot.  Wash your hands often or use hand sanitizer often.  Avoid contact with people who are sick during cold and flu season.  Eat healthy foods.  Drink plenty of  fluids.  Get enough sleep.  Exercise regularly. Get help right away if:  You start to be short of breath or have trouble breathing.  Your skin or nails turn a bluish color.  You have very bad pain or stiffness in your neck.  You get a sudden headache.  You get sudden pain in your face or ear.  You cannot stop throwing up. This information is not intended to replace advice given to you by your health care provider. Make sure you discuss any questions you have with your health care provider. Document Released: 01/04/2008 Document Revised: 09/02/2015 Document Reviewed: 01/19/2015 Elsevier Interactive Patient Education  2017 [employer].

## 2016-04-13 NOTE — Progress Notes (Signed)
HPI:  Julie Savage is a pleasant 66 year old here for an acute visit for flu like symptoms. She reports that her symptoms started about 5 days ago. She has had nausea, a few episodes of diarrhea, body aches, nasal congestion, postnasal drip, intermittent fever, and cough. Her son had similar symptoms. She denies vomiting, skin rash, recent travel, shortness of breath, sinus pain or ear pain. She wants to get a chest x-ray as she has had some thick yellow mucus and is worried about pneumonia. Asthma is listed on her problem list, however she denies this diagnosis. Reports she has a family history of asthma. Denies any wheezing, excessive coughing or trouble breathing.  ROS: See pertinent positives and negatives per HPI.  Past Medical History:  Diagnosis Date  . AMNESIA, TRANSIENT GLOBAL 11/24/2008  . ASTHMA 10/27/2008  . Essential hypertension 10/27/2008  . OSTEOPOROSIS 10/27/2008    Past Surgical History:  Procedure Laterality Date  . COLONOSCOPY    . dental implants    . NM MYOVIEW LTD  05/2003   NORMAL STUDY.  EF ~69%. NO ISCHEMIA OR INFARCTION  . TRANSTHORACIC ECHOCARDIOGRAM  11/2008   Normal LV function. Gr 1 DD. Mild MR. Peak PAP ~35 mmHg    Family History  Problem Relation Age of Onset  . Diabetes Brother     type ll  . Colon cancer Brother 20  . Heart disease Mother 35    CVA, she is otherwise alive and well at age 51  . Lung cancer Father 3    Deceased  . Cancer Sister     breast  . Hypertension Brother     Third brother  . Diabetes Brother     DM type II, second brother  . Hypertension Sister     Third of 4 sisters  . Hyperlipidemia Sister     Fourth of 4 sisters    Social History   Social History  . Marital status: Married    Spouse name: Sonia Side  . Number of children: 3  . Years of education: 12   Occupational History  . Retired     Electronics engineer   Social History Main Topics  . Smoking status: Never Smoker  . Smokeless tobacco: Never Used    . Alcohol use Yes     Comment: occasionally  . Drug use: No  . Sexual activity: Not Asked   Other Topics Concern  . None   Social History Narrative   Married mother of 3, grandmother of 2. Lives with her husband and one of her sons. She is a retired Electronics engineer. Has a high school education.   Never smoked. Maybe drinks 2 glasses of wine a week.   She routinely exercises about 3 days a week for 60 minutes doing walking.     Current Outpatient Prescriptions:  .  amLODipine (NORVASC) 5 MG tablet, Take 1 tablet (5 mg total) by mouth daily., Disp: 90 tablet, Rfl: 3 .  Calcium Carbonate-Vitamin D (CALCIUM-VITAMIN D) 500-200 MG-UNIT per tablet, Take 1 tablet by mouth 2 (two) times daily with a meal.  , Disp: , Rfl:  .  fish oil-omega-3 fatty acids 1000 MG capsule, Take 1 g by mouth daily. One tab every other day, Disp: , Rfl:  .  Multiple Vitamins-Minerals (CENTRUM SILVER PO), Take 1 tablet by mouth daily. , Disp: , Rfl:  .  benzonatate (TESSALON PERLES) 100 MG capsule, Take 1 capsule (100 mg total) by mouth 3 (three) times daily as  needed., Disp: 20 capsule, Rfl: 0 .  ondansetron (ZOFRAN) 4 MG tablet, Take 1 tablet (4 mg total) by mouth every 8 (eight) hours as needed for nausea or vomiting., Disp: 20 tablet, Rfl: 0  EXAM:  Vitals:   04/13/16 1029  BP: 122/90  Pulse: 64  Temp: 98.3 F (36.8 C)    Body mass index is 19.43 kg/m.  GENERAL: vitals reviewed and listed above, alert, oriented, appears well hydrated and in no acute distress  HEENT: atraumatic, conjunttiva clear, no obvious abnormalities on inspection of external nose and ears  NECK: no obvious masses on inspection  LUNGS: clear to auscultation bilaterally, no wheezes, rales or rhonchi, good air movement  CV: HRRR, no peripheral edema  MS: moves all extremities without noticeable abnormality  PSYCH: pleasant and cooperative, no obvious depression or anxiety  ASSESSMENT AND PLAN:  Discussed the  following assessment and plan:  Influenza-like illness - Plan: DG Chest 2 View  -we discussed possible serious and likely etiologies, workup and treatment, treatment risks and return precautions - viral illness or influenza most likely. At this point, Tamiflu may not be very helpful. After discussion of indications, benefits and risk they opted not to take. Rapid flu test negative. -after this discussion, Siyanna opted for symptomatic care (Tessalon and Zofran and sent to pharmacy), chest x-ray to exclude pneumonia and strict return precautions in case she is not improving or develops signs or symptoms of secondary bacterial infection -of course, we advised Alicya  to return or notify a doctor immediately if symptoms worsen or persist or new concerns arise.   Patient Instructions  BEFORE YOU LEAVE: -rapid flu test -xray sheet -follow up: as needed  I hope you feel better soon.  Go get the chest xray.  Please drink plenty of fluids.  Imodium if further diarrhea.  Can use zofran if needed per instructions for nausea and vomiting.  Can use tessalon for cough.  Seek care if worsening, new concerns or you not improving over the next few days.   Influenza, Adult Influenza ("the flu") is an infection in the lungs, nose, and throat (respiratory tract). It is caused by a virus. The flu causes many common cold symptoms, as well as a high fever and body aches. It can make you feel very sick. The flu spreads easily from person to person (is contagious). Getting a flu shot (influenza vaccination) every year is the best way to prevent the flu. Follow these instructions at home:  Take over-the-counter and prescription medicines only as told by your doctor.  Use a cool mist humidifier to add moisture (humidity) to the air in your home. This can make it easier to breathe.  Rest as needed.  Drink enough fluid to keep your pee (urine) clear or pale yellow.  Cover your mouth and nose when  you cough or sneeze.  Wash your hands with soap and water often, especially after you cough or sneeze. If you cannot use soap and water, use hand sanitizer.  Stay home from work or school as told by your doctor. Unless you are visiting your doctor, try to avoid leaving home until your fever has been gone for 24 hours without the use of medicine.  Keep all follow-up visits as told by your doctor. This is important. How is this prevented?  Getting a yearly (annual) flu shot is the best way to avoid getting the flu. You may get the flu shot in late summer, fall, or winter. Ask your doctor when  you should get your flu shot.  Wash your hands often or use hand sanitizer often.  Avoid contact with people who are sick during cold and flu season.  Eat healthy foods.  Drink plenty of fluids.  Get enough sleep.  Exercise regularly. Get help right away if:  You start to be short of breath or have trouble breathing.  Your skin or nails turn a bluish color.  You have very bad pain or stiffness in your neck.  You get a sudden headache.  You get sudden pain in your face or ear.  You cannot stop throwing up. This information is not intended to replace advice given to you by your health care provider. Make sure you discuss any questions you have with your health care provider. Document Released: 01/04/2008 Document Revised: 09/02/2015 Document Reviewed: 01/19/2015 Elsevier Interactive Patient Education  2017 [place]., DO

## 2016-04-14 ENCOUNTER — Other Ambulatory Visit: Payer: Self-pay | Admitting: Family Medicine

## 2016-04-14 DIAGNOSIS — J189 Pneumonia, unspecified organism: Secondary | ICD-10-CM

## 2016-04-14 MED ORDER — LEVOFLOXACIN 750 MG PO TABS: 750.0000 mg | ORAL_TABLET | Freq: Every day | ORAL | 0 refills | Status: DC

## 2016-04-14 MED ORDER — ALBUTEROL SULFATE HFA 108 (90 BASE) MCG/ACT IN AERS: 2.0000 | INHALATION_SPRAY | Freq: Four times a day (QID) | RESPIRATORY_TRACT | 0 refills | Status: DC | PRN

## 2016-04-24 ENCOUNTER — Ambulatory Visit (INDEPENDENT_AMBULATORY_CARE_PROVIDER_SITE_OTHER): Payer: BLUE CROSS/BLUE SHIELD | Admitting: Family Medicine

## 2016-04-24 VITALS — BP 120/90 | HR 75 | Temp 97.4°F | Ht 64.0 in | Wt 114.2 lb

## 2016-04-24 DIAGNOSIS — J189 Pneumonia, unspecified organism: Secondary | ICD-10-CM | POA: Diagnosis not present

## 2016-04-24 NOTE — Patient Instructions (Addendum)
Follow up for any fever, shortness of breath, or other concerns. Get CXR around 05-04-16

## 2016-04-24 NOTE — Progress Notes (Signed)
Subjective:     Patient ID: Julie Savage, female   DOB: 1950-07-01, 67 y.o.   MRN: [id number]  HPI Patient seen for follow-up community-acquired pneumonia. She was seen here on January 4 with what initially sounded more like a viral type symptoms. She did have some cough and pain with coughing and chest x-ray showed question of patchy bronchopneumonia bilaterally. She was placed on Zithromax. She feels better at this time. Still has some residual fatigue. Occasional cough. No fever. No dyspnea. She had recent small bilateral pleural effusions. Never smoked. Flu screen was negative. Turned 65 this year. Has not had an ammonia vaccination yet.  Past Medical History:  Diagnosis Date  . AMNESIA, TRANSIENT GLOBAL 11/24/2008  . ASTHMA 10/27/2008  . Essential hypertension 10/27/2008  . OSTEOPOROSIS 10/27/2008   Past Surgical History:  Procedure Laterality Date  . COLONOSCOPY    . dental implants    . NM MYOVIEW LTD  05/2003   NORMAL STUDY.  EF ~69%. NO ISCHEMIA OR INFARCTION  . TRANSTHORACIC ECHOCARDIOGRAM  11/2008   Normal LV function. Gr 1 DD. Mild MR. Peak PAP ~35 mmHg    reports that she has never smoked. She has never used smokeless tobacco. She reports that she drinks alcohol. She reports that she does not use drugs. family history includes Cancer in her sister; Colon cancer (age of onset: 53) in her brother; Diabetes in her brother and brother; Heart disease (age of onset: 34) in her mother; Hyperlipidemia in her sister; Hypertension in her brother and sister; Lung cancer (age of onset: 54) in her father. Allergies  Allergen Reactions  . Aspirin Rash  . Penicillins Rash     Review of Systems  Constitutional: Positive for fatigue. Negative for chills and fever.  Respiratory: Positive for cough. Negative for shortness of breath and wheezing.   Cardiovascular: Negative for chest pain.       Objective:   Physical Exam  Constitutional: She appears well-developed and well-nourished.   HENT:  Mouth/Throat: Oropharynx is clear and moist.  Neck: Neck supple.  Cardiovascular: Normal rate and regular rhythm.   Pulmonary/Chest: Effort normal and breath sounds normal. No respiratory distress. She has no wheezes. She has no rales.  Lymphadenopathy:    She has no cervical adenopathy.       Assessment:     Recent community-acquired pneumonia improved    Plan:     -Follow-up chest x-ray in about 2 weeks to document clearing -Gradually increase her activities as tolerated -Recommend Prevnar 13 at follow-up  Julie Post MD [facility]

## 2016-04-24 NOTE — Progress Notes (Signed)
Pre visit review using our clinic review tool, if applicable. No additional management support is needed unless otherwise documented below in the visit note. 

## 2016-04-30 ENCOUNTER — Encounter (HOSPITAL_COMMUNITY): Payer: Self-pay | Admitting: *Deleted

## 2016-04-30 ENCOUNTER — Ambulatory Visit (HOSPITAL_COMMUNITY)
Admission: EM | Admit: 2016-04-30 | Discharge: 2016-04-30 | Disposition: A | Discharge status: Home / Self Care | Payer: BLUE CROSS/BLUE SHIELD | Attending: Emergency Medicine | Admitting: Emergency Medicine

## 2016-04-30 ENCOUNTER — Ambulatory Visit (INDEPENDENT_AMBULATORY_CARE_PROVIDER_SITE_OTHER): Payer: BLUE CROSS/BLUE SHIELD

## 2016-04-30 DIAGNOSIS — J069 Acute upper respiratory infection, unspecified: Secondary | ICD-10-CM

## 2016-04-30 DIAGNOSIS — B9789 Other viral agents as the cause of diseases classified elsewhere: Secondary | ICD-10-CM

## 2016-04-30 HISTORY — DX: Cardiac murmur, unspecified: R01.1

## 2016-04-30 MED ORDER — ALBUTEROL SULFATE HFA 108 (90 BASE) MCG/ACT IN AERS: 2.0000 | INHALATION_SPRAY | RESPIRATORY_TRACT | 0 refills | Status: AC | PRN

## 2016-04-30 MED ORDER — HYDROCODONE-HOMATROPINE 5-1.5 MG/5ML PO SYRP
5.0000 mL | ORAL_SOLUTION | Freq: Four times a day (QID) | ORAL | 0 refills | Status: DC | PRN
Start: 2016-04-30 — End: 2016-07-12

## 2016-04-30 NOTE — ED Provider Notes (Signed)
[place]    CSN: [id number] Arrival date & time: 04/30/16  1540     History   Chief Complaint Chief Complaint  Patient presents with  . Weakness  . Cough    HPI Julie Savage is a 66 y.o. female.   HPI  She is a 66 year old woman here for evaluation of weakness and cough. She was seen 2 weeks ago and diagnosed with a pneumonia on x-ray. She was treated with antibiotics and was feeling much better. Yesterday, she started feeling weak and developed a cough. She also reports some wheezing and runny nose. Denies any fevers. She reports a pinching pain in her mid back with deep breaths and cough.  Past Medical History:  Diagnosis Date  . AMNESIA, TRANSIENT GLOBAL 11/24/2008  . ASTHMA 10/27/2008  . Essential hypertension 10/27/2008  . Heart murmur   . OSTEOPOROSIS 10/27/2008    Patient Active Problem List   Diagnosis Date Noted  . Heart palpitations 12/10/2015  . Chest pain with low risk for cardiac etiology 09/28/2015  . Aortic ejection murmur 09/28/2015  . Elevated blood pressure reading 02/05/2013  . AMNESIA, TRANSIENT GLOBAL 11/24/2008  . ASTHMA 10/27/2008  . Osteoporosis 10/27/2008  . Essential hypertension 10/27/2008    Past Surgical History:  Procedure Laterality Date  . COLONOSCOPY    . dental implants    . NM MYOVIEW LTD  05/2003   NORMAL STUDY.  EF ~69%. NO ISCHEMIA OR INFARCTION  . TRANSTHORACIC ECHOCARDIOGRAM  11/2008   Normal LV function. Gr 1 DD. Mild MR. Peak PAP ~35 mmHg    OB History    No data available       Home Medications    Prior to Admission medications   Medication Sig Start Date End Date Taking? Authorizing Provider  amLODipine (NORVASC) 5 MG tablet Take 1 tablet (5 mg total) by mouth daily. 09/07/15  Yes Eulas Post, MD  Calcium Carbonate-Vitamin D (CALCIUM-VITAMIN D) 500-200 MG-UNIT per tablet Take 1 tablet by mouth 2 (two) times daily with a meal.     Yes Historical Provider, MD  fish oil-omega-3 fatty acids 1000  MG capsule Take 1 g by mouth daily. One tab every other day   Yes Historical Provider, MD  Multiple Vitamins-Minerals (CENTRUM SILVER PO) Take 1 tablet by mouth daily.    Yes Historical Provider, MD  albuterol (PROVENTIL HFA;VENTOLIN HFA) 108 (90 Base) MCG/ACT inhaler Inhale 2 puffs into the lungs every 4 (four) hours as needed for wheezing or shortness of breath. 04/30/16   Melony Overly, MD  HYDROcodone-homatropine [facility]) 5-1.5 MG/5ML syrup Take 5 mLs by mouth every 6 (six) hours as needed for cough. 04/30/16   Melony Overly, MD  ondansetron (ZOFRAN) 4 MG tablet Take 1 tablet (4 mg total) by mouth every 8 (eight) hours as needed for nausea or vomiting. 04/13/16   Lucretia Kern, DO    Family History Family History  Problem Relation Age of Onset  . Diabetes Brother     type ll  . Colon cancer Brother 54  . Heart disease Mother 20    CVA, she is otherwise alive and well at age 72  . Lung cancer Father 1    Deceased  . Cancer Sister     breast  . Hypertension Brother     Third brother  . Diabetes Brother     DM type II, second brother  . Hypertension Sister     Third of 4 sisters  .  Hyperlipidemia Sister     Fourth of 4 sisters    Social History Social History  Substance Use Topics  . Smoking status: Never Smoker  . Smokeless tobacco: Never Used  . Alcohol use Yes     Comment: occasionally     Allergies   Aspirin and Penicillins   Review of Systems Review of Systems As in history of present illness  Physical Exam Triage Vital Signs ED Triage Vitals  Enc Vitals Group     BP 04/30/16 1742 178/97     Pulse Rate 04/30/16 1742 68     Resp --      Temp 04/30/16 1742 98.6 F (37 C)     Temp Source 04/30/16 1742 Oral     SpO2 04/30/16 1742 100 %     Weight --      Height --      Head Circumference --      Peak Flow --      Pain Score 04/30/16 1746 5     Pain Loc --      Pain Edu? --      Excl. in [place]? --    No data found.   Updated Vital Signs BP 178/97    Pulse 68   Temp 98.6 F (37 C) (Oral)   SpO2 100%   Visual Acuity Right Eye Distance:   Left Eye Distance:   Bilateral Distance:    Right Eye Near:   Left Eye Near:    Bilateral Near:     Physical Exam  Constitutional: She is oriented to person, place, and time. She appears well-developed and well-nourished. No distress.  HENT:  Nasal mucosa inflamed. Oropharynx clear. TMs normal bilaterally.  Neck: Neck supple.  Cardiovascular: Normal rate, regular rhythm and normal heart sounds.   No murmur heard. Pulmonary/Chest: Effort normal and breath sounds normal. No respiratory distress. She has no wheezes. She has no rales.  Lymphadenopathy:    She has cervical adenopathy (shotty).  Neurological: She is alert and oriented to person, place, and time.     UC Treatments / Results  Labs (all labs ordered are listed, but only abnormal results are displayed) Labs Reviewed - No data to display  EKG  EKG Interpretation None       Radiology Dg Chest 2 View  Result Date: 04/30/2016 CLINICAL DATA:  Cough, recent pneumonia EXAM: CHEST  2 VIEW COMPARISON:  04/13/2016 FINDINGS: Cardiomediastinal silhouette is stable. No infiltrate or pulmonary edema. Mild hyperinflation. Improvement in aeration without residual infiltrate. Osteopenia and mild degenerative changes mid thoracic spine. IMPRESSION: No active cardiopulmonary disease.  Mild hyperinflation. Electronically Signed   By: Lahoma Crocker M.D.   On: 04/30/2016 19:05    Procedures Procedures (including critical care time)  Medications Ordered in UC Medications - No data to display   Initial Impression / Assessment and Plan / UC Course  I have reviewed the triage vital signs and the nursing notes.  Pertinent labs & imaging results that were available during my care of the patient were reviewed by me and considered in my medical decision making (see chart for details).     X-ray shows resolution of pneumonia. Symptomatic treatment  discussed. Hycodan as needed for cough. Follow-up as needed.  Final Clinical Impressions(s) / UC Diagnoses   Final diagnoses:  Viral URI with cough    New Prescriptions New Prescriptions   HYDROCODONE-HOMATROPINE (HYCODAN) 5-1.5 MG/5ML SYRUP    Take 5 mLs by mouth every 6 (six) hours as  needed for cough.     Melony Overly, MD 04/30/16 [phone]

## 2016-04-30 NOTE — Discharge Instructions (Signed)
Your x-ray is much improved. Your current symptoms are caused by a virus. Drink plenty of fluids and get plenty of rest. Take Tylenol or ibuprofen as needed for fever or body aches. Take a daily allergy pill such as Claritin or Zyrtec to help with runny nose. Use the Hycodan every 6 hours as needed for cough. Do not drive all taking this medicine. Use albuterol every 4 hours as needed for wheezing. This should improve over the next week. Follow-up as needed.

## 2016-04-30 NOTE — ED Triage Notes (Signed)
Was diagnosed with pneumonia approx 2 wks ago; states got better, but now starting to feel weak, tired, over past 2 days.  Also having wheezing, pain in back with coughing again.  Denies fevers.

## 2016-05-01 ENCOUNTER — Other Ambulatory Visit: Payer: Self-pay | Admitting: Family Medicine

## 2016-05-01 DIAGNOSIS — Z1231 Encounter for screening mammogram for malignant neoplasm of breast: Secondary | ICD-10-CM

## 2016-05-22 ENCOUNTER — Ambulatory Visit
Admission: RE | Admit: 2016-05-22 | Discharge: 2016-05-22 | Disposition: A | Discharge status: Home / Self Care | Payer: Medicare Other | Source: Ambulatory Visit | Attending: Family Medicine | Admitting: Family Medicine

## 2016-05-22 DIAGNOSIS — Z1231 Encounter for screening mammogram for malignant neoplasm of breast: Secondary | ICD-10-CM | POA: Diagnosis not present

## 2016-07-12 ENCOUNTER — Ambulatory Visit (INDEPENDENT_AMBULATORY_CARE_PROVIDER_SITE_OTHER): Payer: Medicare Other | Admitting: Family Medicine

## 2016-07-12 ENCOUNTER — Encounter: Payer: Self-pay | Admitting: Family Medicine

## 2016-07-12 VITALS — BP 120/80 | HR 70 | Temp 98.6°F | Wt 116.0 lb

## 2016-07-12 DIAGNOSIS — R6889 Other general symptoms and signs: Secondary | ICD-10-CM

## 2016-07-12 DIAGNOSIS — J101 Influenza due to other identified influenza virus with other respiratory manifestations: Secondary | ICD-10-CM

## 2016-07-12 DIAGNOSIS — R509 Fever, unspecified: Secondary | ICD-10-CM | POA: Diagnosis not present

## 2016-07-12 LAB — POCT INFLUENZA A/B
INFLUENZA A, POC: POSITIVE — AB
Influenza B, POC: NEGATIVE

## 2016-07-12 MED ORDER — OSELTAMIVIR PHOSPHATE 75 MG PO CAPS: 75.0000 mg | ORAL_CAPSULE | Freq: Two times a day (BID) | ORAL | 0 refills | Status: DC

## 2016-07-12 NOTE — Patient Instructions (Signed)

## 2016-07-12 NOTE — Progress Notes (Signed)
Pre visit review using our clinic review tool, if applicable. No additional management support is needed unless otherwise documented below in the visit note. 

## 2016-07-12 NOTE — Progress Notes (Signed)
Subjective:     Patient ID: Julie Savage, female   DOB: 1950-04-29, 66 y.o.   MRN: [id number]  HPI   Acute visit. She had fairly sudden onset last night of fever up to 101.8 with cough, sore throat, bodyaches, chills, and headache. No nausea or vomiting. No diarrhea. Recently was down in [place], [place] visiting family.  No known sick contacts.  Past Medical History:  Diagnosis Date  . AMNESIA, TRANSIENT GLOBAL 11/24/2008  . ASTHMA 10/27/2008  . Essential hypertension 10/27/2008  . Heart murmur   . OSTEOPOROSIS 10/27/2008   Past Surgical History:  Procedure Laterality Date  . COLONOSCOPY    . dental implants    . NM MYOVIEW LTD  05/2003   NORMAL STUDY.  EF ~69%. NO ISCHEMIA OR INFARCTION  . TRANSTHORACIC ECHOCARDIOGRAM  11/2008   Normal LV function. Gr 1 DD. Mild MR. Peak PAP ~35 mmHg    reports that she has never smoked. She has never used smokeless tobacco. She reports that she drinks alcohol. She reports that she does not use drugs. family history includes Cancer in her sister; Colon cancer (age of onset: 19) in her brother; Diabetes in her brother and brother; Heart disease (age of onset: 48) in her mother; Hyperlipidemia in her sister; Hypertension in her brother and sister; Lung cancer (age of onset: 7) in her father. Allergies  Allergen Reactions  . Aspirin Rash  . Penicillins Rash     Review of Systems  Constitutional: Positive for chills, fatigue and fever.  HENT: Positive for sore throat.   Respiratory: Positive for cough.        Objective:   Physical Exam  Constitutional: She appears well-developed and well-nourished.  HENT:  Right Ear: External ear normal.  Left Ear: External ear normal.  Mouth/Throat: Oropharynx is clear and moist.  Neck: Neck supple.  Cardiovascular: Normal rate and regular rhythm.   Pulmonary/Chest: Effort normal and breath sounds normal. No respiratory distress. She has no wheezes. She has no rales.  Lymphadenopathy:    She has no  cervical adenopathy.  Skin: No rash noted.       Assessment:     Viral syndrome. Positive screen for influenza A    Plan:     -Tamiflu 75 mg by mouth twice a day for 5 days -Push fluids -Follow-up promptly for any persistent or worsening symptoms  Eulas Post MD [facility]

## 2016-07-31 ENCOUNTER — Encounter: Payer: Self-pay | Admitting: Family Medicine

## 2016-07-31 ENCOUNTER — Ambulatory Visit (INDEPENDENT_AMBULATORY_CARE_PROVIDER_SITE_OTHER): Payer: Medicare Other | Admitting: Family Medicine

## 2016-07-31 VITALS — BP 130/90 | HR 66 | Temp 98.2°F | Ht 65.0 in | Wt 117.7 lb

## 2016-07-31 DIAGNOSIS — I10 Essential (primary) hypertension: Secondary | ICD-10-CM

## 2016-07-31 DIAGNOSIS — Z0000 Encounter for general adult medical examination without abnormal findings: Secondary | ICD-10-CM | POA: Diagnosis not present

## 2016-07-31 DIAGNOSIS — Z23 Encounter for immunization: Secondary | ICD-10-CM | POA: Diagnosis not present

## 2016-07-31 LAB — BASIC METABOLIC PANEL
BUN: 15 mg/dL (ref 6–23)
CALCIUM: 9.5 mg/dL (ref 8.4–10.5)
CHLORIDE: 102 meq/L (ref 96–112)
CO2: 32 mEq/L (ref 19–32)
CREATININE: 0.55 mg/dL (ref 0.40–1.20)
GFR: 117.68 mL/min (ref >60.00)
Glucose, Bld: 119 mg/dL — ABNORMAL HIGH (ref 70–99)
Potassium: 3.9 mEq/L (ref 3.5–5.1)
Sodium: 141 mEq/L (ref 135–145)

## 2016-07-31 NOTE — Patient Instructions (Addendum)
Remember annual flu vaccine You will need Pneumovax in one year You received Prevnar 13 today.   We have a new shingles vaccine (Shingrix)  Check on coverage if interested.

## 2016-07-31 NOTE — Progress Notes (Signed)
Subjective:     Patient ID: Julie Savage, female   DOB: 1950-05-02, 66 y.o.   MRN: [id number]  HPI Patient seen for Welcome to Medicare exam. She has past history of hypertension, osteoporosis, previous episode of transient global amnesia.  She currently takes amlodipine 5 mg daily and no other regular prescription medications.  She is married. Has never smoked. No alcohol use. She has couple of children.  Walks daily for exercise.  Past Medical History:  Diagnosis Date  . AMNESIA, TRANSIENT GLOBAL 11/24/2008  . ASTHMA 10/27/2008  . Essential hypertension 10/27/2008  . Heart murmur   . OSTEOPOROSIS 10/27/2008   Past Surgical History:  Procedure Laterality Date  . COLONOSCOPY    . dental implants    . NM MYOVIEW LTD  05/2003   NORMAL STUDY.  EF ~69%. NO ISCHEMIA OR INFARCTION  . TRANSTHORACIC ECHOCARDIOGRAM  11/2008   Normal LV function. Gr 1 DD. Mild MR. Peak PAP ~35 mmHg    reports that she has never smoked. She has never used smokeless tobacco. She reports that she drinks alcohol. She reports that she does not use drugs. family history includes Cancer in her sister; Colon cancer (age of onset: 32) in her brother; Diabetes in her brother and brother; Heart disease (age of onset: 47) in her mother; Hyperlipidemia in her sister; Hypertension in her brother and sister; Lung cancer (age of onset: 3) in her father. Allergies  Allergen Reactions  . Aspirin Rash  . Penicillins Rash   1.  Risk factors based on Past Medical , Social, and Family history reviewed and as indicated above with no changes 2.  Limitations in physical activities None.  No recent falls. 3.  Depression/mood No active depression or anxiety issues PHQ-2 score of 0 4.  Hearing No defiits 5.  ADLs independent in all. 6.  Cognitive function (orientation to time and place, language, writing, speech,memory) no short or long term memory issues.  Language and judgement intact. 7.  Home Safety no issues 8.  Height,  weight, and visual acuity.all stable.  See vision screen below. 9.  Counseling discussed osteoporosis progression prevention; 10. Recommendation of preventive services.Prevnar 13 and follow-up with Pneumovax in 1 year. Continue with yearly flu vaccine. Continue with at least every other year mammogram. She had Pap smear less than 2 years ago normal and low risk 11. Labs based on risk factors-Basic metabolic panel 12. Care Plan as above. 13. Other Providers Dr Ellyn Hack- Cardiology. 14. Written schedule of screening/prevention services given to patient.  Advanced directives-none currently. She and her husband are in process of setting up living will and other advanced care directives.  Exercise-several days per week. No recent exercise intolerance Diet-tries to follow low-sodium diet. Has never had any weight concerns.  Vision screen:  B  20/25                          L  20/25                          R 20/40   Review of Systems  Constitutional: Negative for activity change, appetite change, fatigue, fever and unexpected weight change.  HENT: Negative for ear pain, hearing loss, sore throat and trouble swallowing.   Eyes: Negative for visual disturbance.  Respiratory: Negative for cough and shortness of breath.   Cardiovascular: Negative for chest pain and palpitations.  Gastrointestinal: Negative for abdominal pain, blood  in stool, constipation and diarrhea.  Genitourinary: Negative for dysuria and hematuria.  Musculoskeletal: Negative for arthralgias, back pain and myalgias.  Skin: Negative for rash.  Neurological: Negative for dizziness, syncope and headaches.  Hematological: Negative for adenopathy.  Psychiatric/Behavioral: Negative for confusion and dysphoric mood.       Objective:   Physical Exam  Constitutional: She is oriented to person, place, and time. She appears well-developed and well-nourished.  HENT:  Head: Normocephalic and atraumatic.  Eyes: EOM are normal.  Pupils are equal, round, and reactive to light.  Neck: Normal range of motion. Neck supple. No thyromegaly present.  Cardiovascular: Normal rate, regular rhythm and normal heart sounds.   No murmur heard. Pulmonary/Chest: Breath sounds normal. No respiratory distress. She has no wheezes. She has no rales.  Abdominal: Soft. Bowel sounds are normal. She exhibits no distension and no mass. There is no tenderness. There is no rebound and no guarding.  Musculoskeletal: Normal range of motion. She exhibits no edema.  Lymphadenopathy:    She has no cervical adenopathy.  Neurological: She is alert and oriented to person, place, and time. She displays normal reflexes. No cranial nerve deficit.  Skin: No rash noted.  Psychiatric: She has a normal mood and affect. Her behavior is normal. Judgment and thought content normal.       Assessment:     Welcome to Medicare physical. Patient is generally very healthy. She has hypertension which has been very stable by home readings    Plan:     -Prevnar 13 given -Recommend Pneumovax in 1 year -Check basic metabolic panel -Continue with annual flu vaccine -Continue every other year mammogram -Consider repeat bone density scan in one year -Continue with every 3 year Pap smears -Continue regular weightbearing exercise  Eulas Post MD [facility] at [facility]

## 2016-07-31 NOTE — Progress Notes (Signed)
Pre visit review using our clinic review tool, if applicable. No additional management support is needed unless otherwise documented below in the visit note. 

## 2016-09-28 ENCOUNTER — Other Ambulatory Visit: Payer: Self-pay | Admitting: Family Medicine

## 2016-10-27 ENCOUNTER — Ambulatory Visit (INDEPENDENT_AMBULATORY_CARE_PROVIDER_SITE_OTHER): Payer: Medicare Other | Admitting: Family Medicine

## 2016-10-27 ENCOUNTER — Encounter: Payer: Self-pay | Admitting: Family Medicine

## 2016-10-27 VITALS — BP 140/80 | HR 63 | Temp 98.0°F | Wt 117.1 lb

## 2016-10-27 DIAGNOSIS — L821 Other seborrheic keratosis: Secondary | ICD-10-CM

## 2016-10-27 NOTE — Patient Instructions (Signed)
Seborrheic Keratosis Seborrheic keratosis is a common, noncancerous (benign) skin growth. This condition causes waxy, rough, tan, brown, or black spots to appear on the skin. These skin growths can be flat or raised. What are the causes? The cause of this condition is not known. What increases the risk? This condition is more likely to develop in:  People who have a family history of seborrheic keratosis.  People who are 50 or older.  People who are pregnant.  People who have had estrogen replacement therapy.  What are the signs or symptoms? This condition often occurs on the face, chest, shoulders, back, or other areas. These growths:  Are usually painless, but may become irritated and itchy.  Can be yellow, brown, black, or other colors.  Are slightly raised or have a flat surface.  Are sometimes rough or wart-like in texture.  Are often waxy on the surface.  Are round or oval-shaped.  Sometimes look like they are "stuck on."  Often occur in groups, but may occur as a single growth.  How is this diagnosed? This condition is diagnosed with a medical history and physical exam. A sample of the growth may be tested (skin biopsy). You may need to see a skin specialist (dermatologist). How is this treated? Treatment is not usually needed for this condition, unless the growths are irritated or are often bleeding. You may also choose to have the growths removed if you do not like their appearance. Most commonly, these growths are treated with a procedure in which liquid nitrogen is applied to "freeze" off the growth (cryosurgery). They may also be burned off with electricity or cut off. Follow these instructions at home:  Watch your growth for any changes.  Keep all follow-up visits as told by your health care provider. This is important.  Do not scratch or pick at the growth or growths. This can cause them to become irritated or infected. Contact a health care provider  if:  You suddenly have many new growths.  Your growth bleeds, itches, or hurts.  Your growth suddenly becomes larger or changes color. This information is not intended to replace advice given to you by your health care provider. Make sure you discuss any questions you have with your health care provider. Document Released: 04/29/2010 Document Revised: 09/02/2015 Document Reviewed: 08/12/2014 Elsevier Interactive Patient Education  2017 Elsevier Inc.  

## 2016-10-27 NOTE — Progress Notes (Signed)
Subjective:     Patient ID: Julie Savage, female   DOB: 11-29-1950, 66 y.o.   MRN: [id number]  HPI Patient was initially scheduled for procedure visit. However, she really just wants to have skin lesion checked on her back. She has brown scaly lesion which itches occasionally. No history of skin cancer. Currently she was noted by her husband and he had concerns because of the size and color.  Past Medical History:  Diagnosis Date  . AMNESIA, TRANSIENT GLOBAL 11/24/2008  . ASTHMA 10/27/2008  . Essential hypertension 10/27/2008  . Heart murmur   . OSTEOPOROSIS 10/27/2008   Past Surgical History:  Procedure Laterality Date  . COLONOSCOPY    . dental implants    . NM MYOVIEW LTD  05/2003   NORMAL STUDY.  EF ~69%. NO ISCHEMIA OR INFARCTION  . TRANSTHORACIC ECHOCARDIOGRAM  11/2008   Normal LV function. Gr 1 DD. Mild MR. Peak PAP ~35 mmHg    reports that she has never smoked. She has never used smokeless tobacco. She reports that she drinks alcohol. She reports that she does not use drugs. family history includes Cancer in her sister; Colon cancer (age of onset: 25) in her brother; Diabetes in her brother and brother; Heart disease (age of onset: 48) in her mother; Hyperlipidemia in her sister; Hypertension in her brother and sister; Lung cancer (age of onset: 59) in her father. Allergies  Allergen Reactions  . Aspirin Rash  . Penicillins Rash     Review of Systems  Constitutional: Negative for appetite change.       Objective:   Physical Exam  Constitutional: She appears well-developed and well-nourished.  Cardiovascular: Normal rate and regular rhythm.   Pulmonary/Chest: Effort normal and breath sounds normal. No respiratory distress. She has no wheezes. She has no rales.  Skin:  Patient has approximately 1 cm well demarcated minimally raised brownish seborrheic keratosis mid thoracic back.  No concerning skin lesions noted       Assessment:     Benign appearing seborrheic  keratosis of the back    Plan:     -Reassurance. -We explained that these tend to be more common with aging and she will likely develop several more with time  Eulas Post MD [facility] at [facility]

## 2016-12-28 ENCOUNTER — Encounter: Payer: Self-pay | Admitting: Family Medicine

## 2017-01-09 ENCOUNTER — Encounter: Payer: Self-pay | Admitting: Family Medicine

## 2017-01-09 ENCOUNTER — Ambulatory Visit (INDEPENDENT_AMBULATORY_CARE_PROVIDER_SITE_OTHER): Payer: Medicare Other | Admitting: Family Medicine

## 2017-01-09 VITALS — BP 130/90 | HR 76 | Temp 99.1°F | Wt 117.8 lb

## 2017-01-09 DIAGNOSIS — H1031 Unspecified acute conjunctivitis, right eye: Secondary | ICD-10-CM

## 2017-01-09 DIAGNOSIS — Z23 Encounter for immunization: Secondary | ICD-10-CM

## 2017-01-09 MED ORDER — TOBRAMYCIN 0.3 % OP SOLN: 2.0000 [drp] | OPHTHALMIC | 0 refills | Status: DC

## 2017-01-09 NOTE — Progress Notes (Signed)
Subjective:     Patient ID: Julie Savage, female   DOB: [birth year], 66 y.o.   MRN: [id number]  HPI Patient seen with right eye irritation. Couple days ago she thinks a bug of some tightness in [place] in her eye. No contact use. She noticed some initial irritation. She's had some progressive redness since then and had a little bit of matting this morning. No left eye symptoms. No blurred vision. Minimal eye irritation. Some itching.  Past Medical History:  Diagnosis Date  . AMNESIA, TRANSIENT GLOBAL 11/24/2008  . ASTHMA 10/27/2008  . Essential hypertension 10/27/2008  . Heart murmur   . OSTEOPOROSIS 10/27/2008   Past Surgical History:  Procedure Laterality Date  . COLONOSCOPY    . dental implants    . NM MYOVIEW LTD  05/2003   NORMAL STUDY.  EF ~69%. NO ISCHEMIA OR INFARCTION  . TRANSTHORACIC ECHOCARDIOGRAM  11/2008   Normal LV function. Gr 1 DD. Mild MR. Peak PAP ~35 mmHg    reports that she has never smoked. She has never used smokeless tobacco. She reports that she drinks alcohol. She reports that she does not use drugs. family history includes Cancer in her sister; Colon cancer (age of onset: 20) in her brother; Diabetes in her brother and brother; Heart disease (age of onset: 17) in her mother; Hyperlipidemia in her sister; Hypertension in her brother and sister; Lung cancer (age of onset: 29) in her father. Allergies  Allergen Reactions  . Aspirin Rash  . Penicillins Rash     Review of Systems  Constitutional: Negative for chills and unexpected weight change.  Eyes: Positive for discharge, redness and itching. Negative for visual disturbance.       Objective:   Physical Exam  Constitutional: She appears well-developed and well-nourished.  Eyes: Pupils are equal, round, and reactive to light. EOM are normal.  Patient has some erythema involving the right eye especially conjunctiva. She has some yellowish. Like secretions along the inner canthus region. Pupils are equal  round reactive to light. Extraocular movements normal. Cornea no visible foreign bodies.  Fluoroscein stain applied-did not see any evidence to suggest corneal abrasion or foreign body.       Assessment:     Right eye conjunctivitis. No visible foreign body    Plan:     -Warm compresses to right eye several times daily -Tobrex eyedrops 2 drops every 4 hours while awake -Touch base in 2-3 days if symptoms not resolving in sooner for any blurred vision or other concerns  Eulas Post MD [facility] at [facility]

## 2017-01-09 NOTE — Patient Instructions (Signed)
Start the antibiotic and use warm compresses several times daily Touch base in 2 days if not improved and sooner as needed

## 2017-03-06 ENCOUNTER — Ambulatory Visit (INDEPENDENT_AMBULATORY_CARE_PROVIDER_SITE_OTHER): Payer: Medicare Other | Admitting: Family Medicine

## 2017-03-06 ENCOUNTER — Encounter: Payer: Self-pay | Admitting: Family Medicine

## 2017-03-06 VITALS — BP 140/94 | HR 82 | Temp 99.0°F | Wt 112.9 lb

## 2017-03-06 DIAGNOSIS — G4700 Insomnia, unspecified: Secondary | ICD-10-CM

## 2017-03-06 DIAGNOSIS — I10 Essential (primary) hypertension: Secondary | ICD-10-CM | POA: Diagnosis not present

## 2017-03-06 DIAGNOSIS — F418 Other specified anxiety disorders: Secondary | ICD-10-CM

## 2017-03-06 NOTE — Patient Instructions (Signed)

## 2017-03-06 NOTE — Progress Notes (Signed)
Subjective:     Patient ID: Julie Savage, female   DOB: Feb 24, 1951, 66 y.o.   MRN: [id number]  HPI Patient seen to discuss anxiety and sleep issues. Her son was just recently diagnosed with bipolar disorder and admitted for 5 days to psychiatric unit. He was just discharged yesterday and patient having tremendous difficulty coping with this. She has hypertension which is treated with amlodipine. She's had some recent elevations are somewhat sporadic home and she realizes is probably stress-related. Patient had decline in appetite and has lost a few pounds from last visit. She has difficulty both falling asleep and staying asleep. Frequently getting about 5 hours of interrupted sleep. She is not interested in medication and would like to try nonpharmacologic ways of managing at this point.  Past Medical History:  Diagnosis Date  . AMNESIA, TRANSIENT GLOBAL 11/24/2008  . ASTHMA 10/27/2008  . Essential hypertension 10/27/2008  . Heart murmur   . OSTEOPOROSIS 10/27/2008   Past Surgical History:  Procedure Laterality Date  . COLONOSCOPY    . dental implants    . NM MYOVIEW LTD  05/2003   NORMAL STUDY.  EF ~69%. NO ISCHEMIA OR INFARCTION  . TRANSTHORACIC ECHOCARDIOGRAM  11/2008   Normal LV function. Gr 1 DD. Mild MR. Peak PAP ~35 mmHg    reports that  has never smoked. she has never used smokeless tobacco. She reports that she drinks alcohol. She reports that she does not use drugs. family history includes Cancer in her sister; Colon cancer (age of onset: 65) in her brother; Diabetes in her brother and brother; Heart disease (age of onset: 74) in her mother; Hyperlipidemia in her sister; Hypertension in her brother and sister; Lung cancer (age of onset: 63) in her father. Allergies  Allergen Reactions  . Aspirin Rash  . Penicillins Rash     Review of Systems  Constitutional: Positive for appetite change and fatigue.  Respiratory: Negative for shortness of breath.   Cardiovascular: Negative  for chest pain.  Psychiatric/Behavioral: Positive for sleep disturbance. Negative for agitation, confusion and suicidal ideas. The patient is nervous/anxious.        Objective:   Physical Exam  Constitutional: She is oriented to person, place, and time. She appears well-developed and well-nourished.  Neck: Neck supple.  Cardiovascular: Normal rate and regular rhythm.  Pulmonary/Chest: Effort normal and breath sounds normal. No respiratory distress. She has no wheezes. She has no rales.  Lymphadenopathy:    She has no cervical adenopathy.  Neurological: She is alert and oriented to person, place, and time. No cranial nerve deficit.  Psychiatric: She has a normal mood and affect. Her behavior is normal. Judgment and thought content normal.       Assessment:     #1 Situational stress and anxiety with acute insomnia  #2 hypertension-marginal control and probably exacerbated by recent stressors    Plan:     -We discussed nonpharmacologic ways of managing. She prefers to avoid medications at this point which I think is very reasonable -Exercise as tolerated -Discussed counseling options and gave her some names -Monitor blood pressure be in touch if consistently greater than 086 systolic -Over 25 minutes were spent with patient of which greater than 50% in direct counseling regarding her insomnia and situational stress issues  Eulas Post MD [facility] at [facility] MD [facility] at [facility]

## 2017-04-08 ENCOUNTER — Other Ambulatory Visit: Payer: Self-pay | Admitting: Family Medicine

## 2017-05-14 ENCOUNTER — Other Ambulatory Visit: Payer: Self-pay | Admitting: Family Medicine

## 2017-05-14 DIAGNOSIS — Z1231 Encounter for screening mammogram for malignant neoplasm of breast: Secondary | ICD-10-CM

## 2017-05-29 ENCOUNTER — Ambulatory Visit (INDEPENDENT_AMBULATORY_CARE_PROVIDER_SITE_OTHER): Payer: Medicare Other | Admitting: Family Medicine

## 2017-05-29 ENCOUNTER — Encounter: Payer: Self-pay | Admitting: Family Medicine

## 2017-05-29 VITALS — BP 148/90 | HR 66 | Temp 98.0°F | Wt 115.7 lb

## 2017-05-29 DIAGNOSIS — L820 Inflamed seborrheic keratosis: Secondary | ICD-10-CM | POA: Diagnosis not present

## 2017-05-29 NOTE — Progress Notes (Signed)
Subjective:     Patient ID: Julie Savage, female   DOB: 07/29/1950, 67 y.o.   MRN: [id number]  HPI Patient seen with irritated itching place on her right upper back. No personal or family history of skin cancer. She has noticed this for several months. Used over-the-counter hydrocortisone cream without any improvement.  No exacerbating factors  Past Medical History:  Diagnosis Date  . AMNESIA, TRANSIENT GLOBAL 11/24/2008  . ASTHMA 10/27/2008  . Essential hypertension 10/27/2008  . Heart murmur   . OSTEOPOROSIS 10/27/2008   Past Surgical History:  Procedure Laterality Date  . COLONOSCOPY    . dental implants    . NM MYOVIEW LTD  05/2003   NORMAL STUDY.  EF ~69%. NO ISCHEMIA OR INFARCTION  . TRANSTHORACIC ECHOCARDIOGRAM  11/2008   Normal LV function. Gr 1 DD. Mild MR. Peak PAP ~35 mmHg    reports that  has never smoked. she has never used smokeless tobacco. She reports that she drinks alcohol. She reports that she does not use drugs. family history includes Cancer in her sister; Colon cancer (age of onset: 22) in her brother; Diabetes in her brother and brother; Heart disease (age of onset: 58) in her mother; Hyperlipidemia in her sister; Hypertension in her brother and sister; Lung cancer (age of onset: 71) in her father. Allergies  Allergen Reactions  . Aspirin Rash  . Penicillins Rash     Review of Systems  Constitutional: Negative for chills and fever.  Hematological: Negative for adenopathy.       Objective:   Physical Exam  Constitutional: She appears well-developed and well-nourished.  Cardiovascular: Normal rate and regular rhythm.  Skin:  Somewhat elongated approximately 1 cm x 0.5 cm well demarcated scaly brownish lesion right upper back       Assessment:     Irritated seborrheic keratosis    Plan:     -Discussed risk and benefits of liquid nitrogen therapy-and patient consented. This was treated without difficulty. Touch base in 2 weeks if this is not  resolving  Eulas Post MD [facility] at [facility]

## 2017-05-29 NOTE — Patient Instructions (Signed)
Seborrheic Keratosis  Seborrheic keratosis is a common, noncancerous (benign) skin growth. This condition causes waxy, rough, tan, brown, or black spots to appear on the skin. These skin growths can be flat or raised.  What are the causes?  The cause of this condition is not known.  What increases the risk?  This condition is more likely to develop in:  · People who have a family history of seborrheic keratosis.  · People who are 50 or older.  · People who are pregnant.  · People who have had estrogen replacement therapy.    What are the signs or symptoms?  This condition often occurs on the face, chest, shoulders, back, or other areas. These growths:  · Are usually painless, but may become irritated and itchy.  · Can be yellow, brown, black, or other colors.  · Are slightly raised or have a flat surface.  · Are sometimes rough or wart-like in texture.  · Are often waxy on the surface.  · Are round or oval-shaped.  · Sometimes look like they are "stuck on.”  · Often occur in groups, but may occur as a single growth.    How is this diagnosed?  This condition is diagnosed with a medical history and physical exam. A sample of the growth may be tested (skin biopsy). You may need to see a skin specialist (dermatologist).  How is this treated?  Treatment is not usually needed for this condition, unless the growths are irritated or are often bleeding. You may also choose to have the growths removed if you do not like their appearance. Most commonly, these growths are treated with a procedure in which liquid nitrogen is applied to “freeze” off the growth (cryosurgery). They may also be burned off with electricity or cut off.  Follow these instructions at home:  · Watch your growth for any changes.  · Keep all follow-up visits as told by your health care provider. This is important.  · Do not scratch or pick at the growth or growths. This can cause them to become irritated or infected.  Contact a health care provider  if:  · You suddenly have many new growths.  · Your growth bleeds, itches, or hurts.  · Your growth suddenly becomes larger or changes color.  This information is not intended to replace advice given to you by your health care provider. Make sure you discuss any questions you have with your health care provider.  Document Released: 04/29/2010 Document Revised: 09/02/2015 Document Reviewed: 08/12/2014  Elsevier Interactive Patient Education © 2018 Elsevier Inc.

## 2017-06-01 ENCOUNTER — Ambulatory Visit
Admission: RE | Admit: 2017-06-01 | Discharge: 2017-06-01 | Disposition: A | Discharge status: Home / Self Care | Payer: Medicare Other | Source: Ambulatory Visit | Attending: Family Medicine | Admitting: Family Medicine

## 2017-06-01 DIAGNOSIS — Z1231 Encounter for screening mammogram for malignant neoplasm of breast: Secondary | ICD-10-CM

## 2017-07-13 DIAGNOSIS — H2513 Age-related nuclear cataract, bilateral: Secondary | ICD-10-CM | POA: Diagnosis not present

## 2017-08-28 IMAGING — DX DG CHEST 2V
2 series · 2 of 2 positions shown · non-contrast
Comparison: There patchy alveolar opacities in the right mid and
lower lung and in the left mid lung.

CLINICAL DATA: Productive cough, fever for 5 days. History of
asthma.

EXAM:
CHEST  2 VIEW

[chest pa]
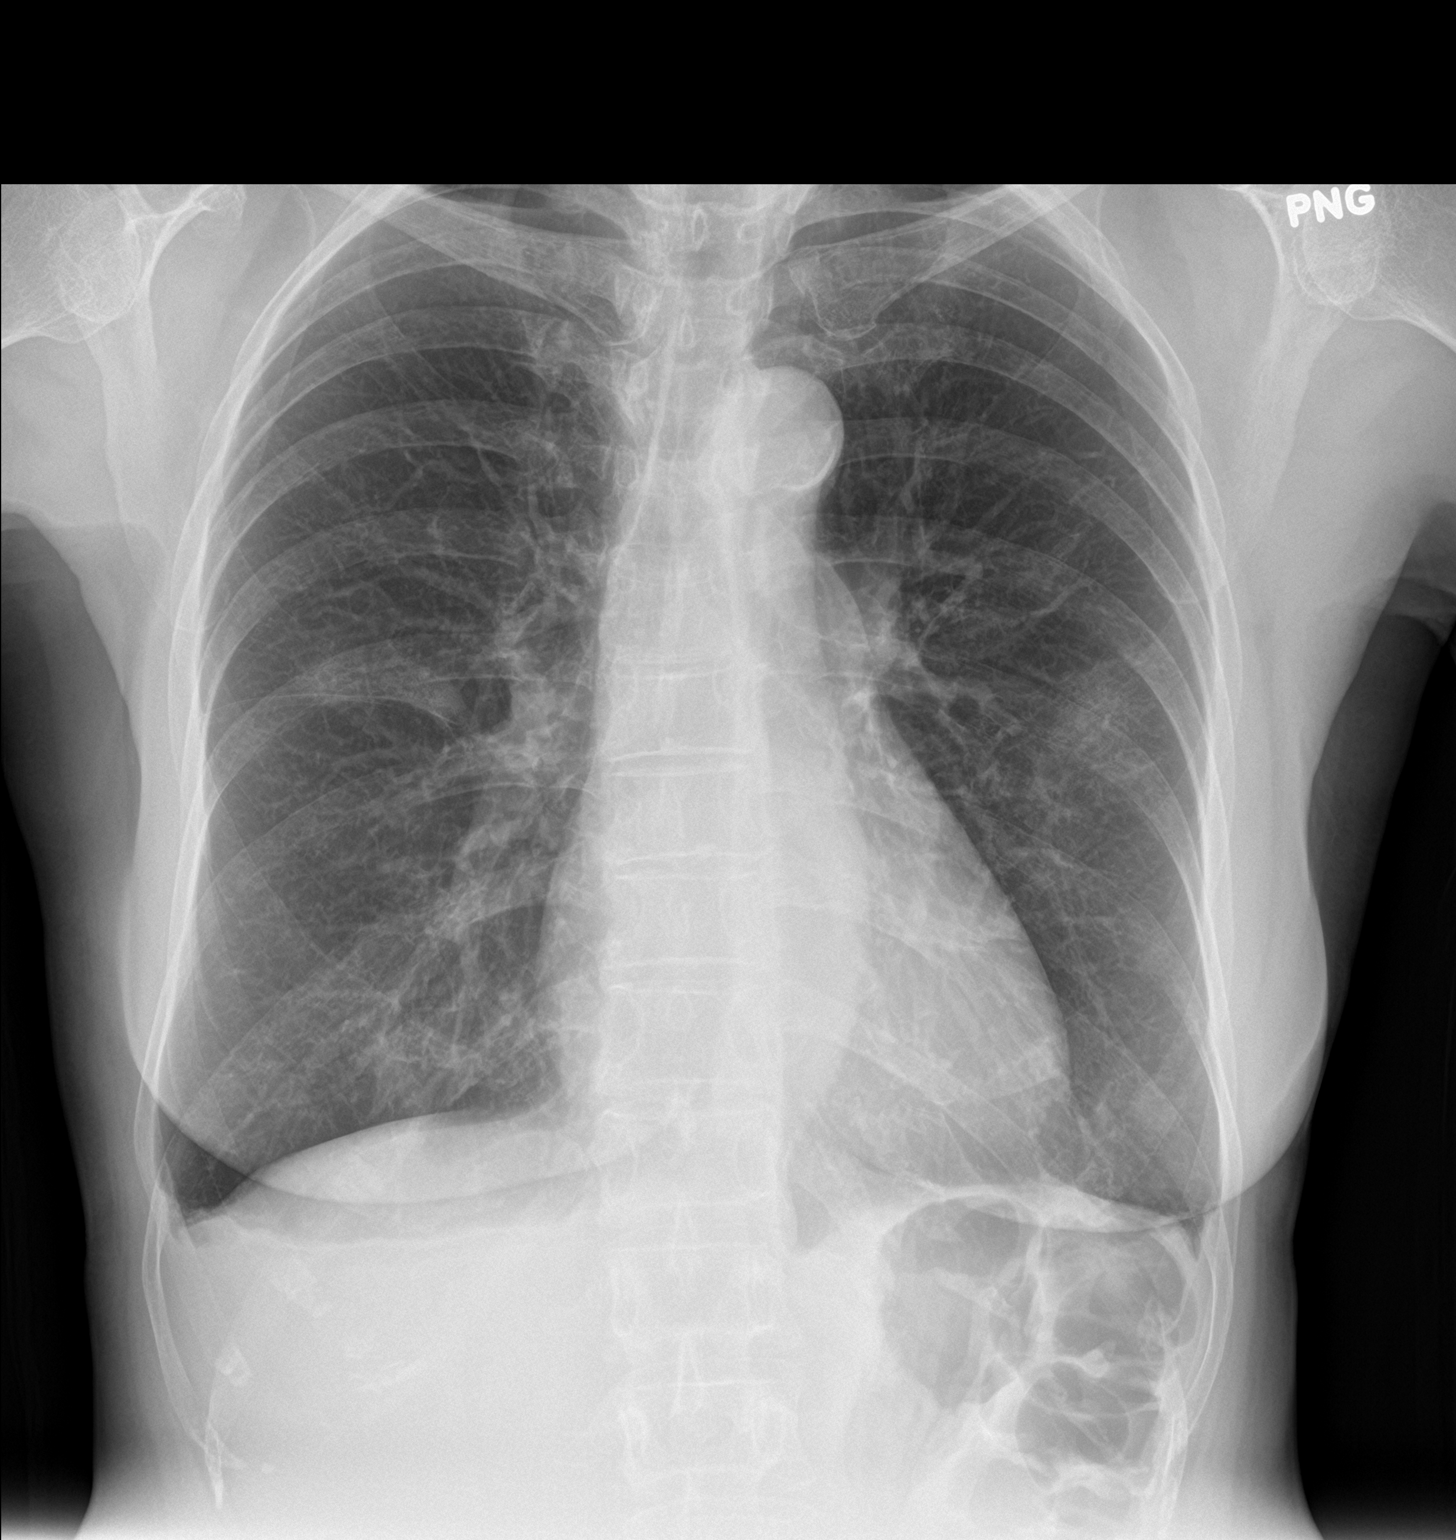

[chest lat]
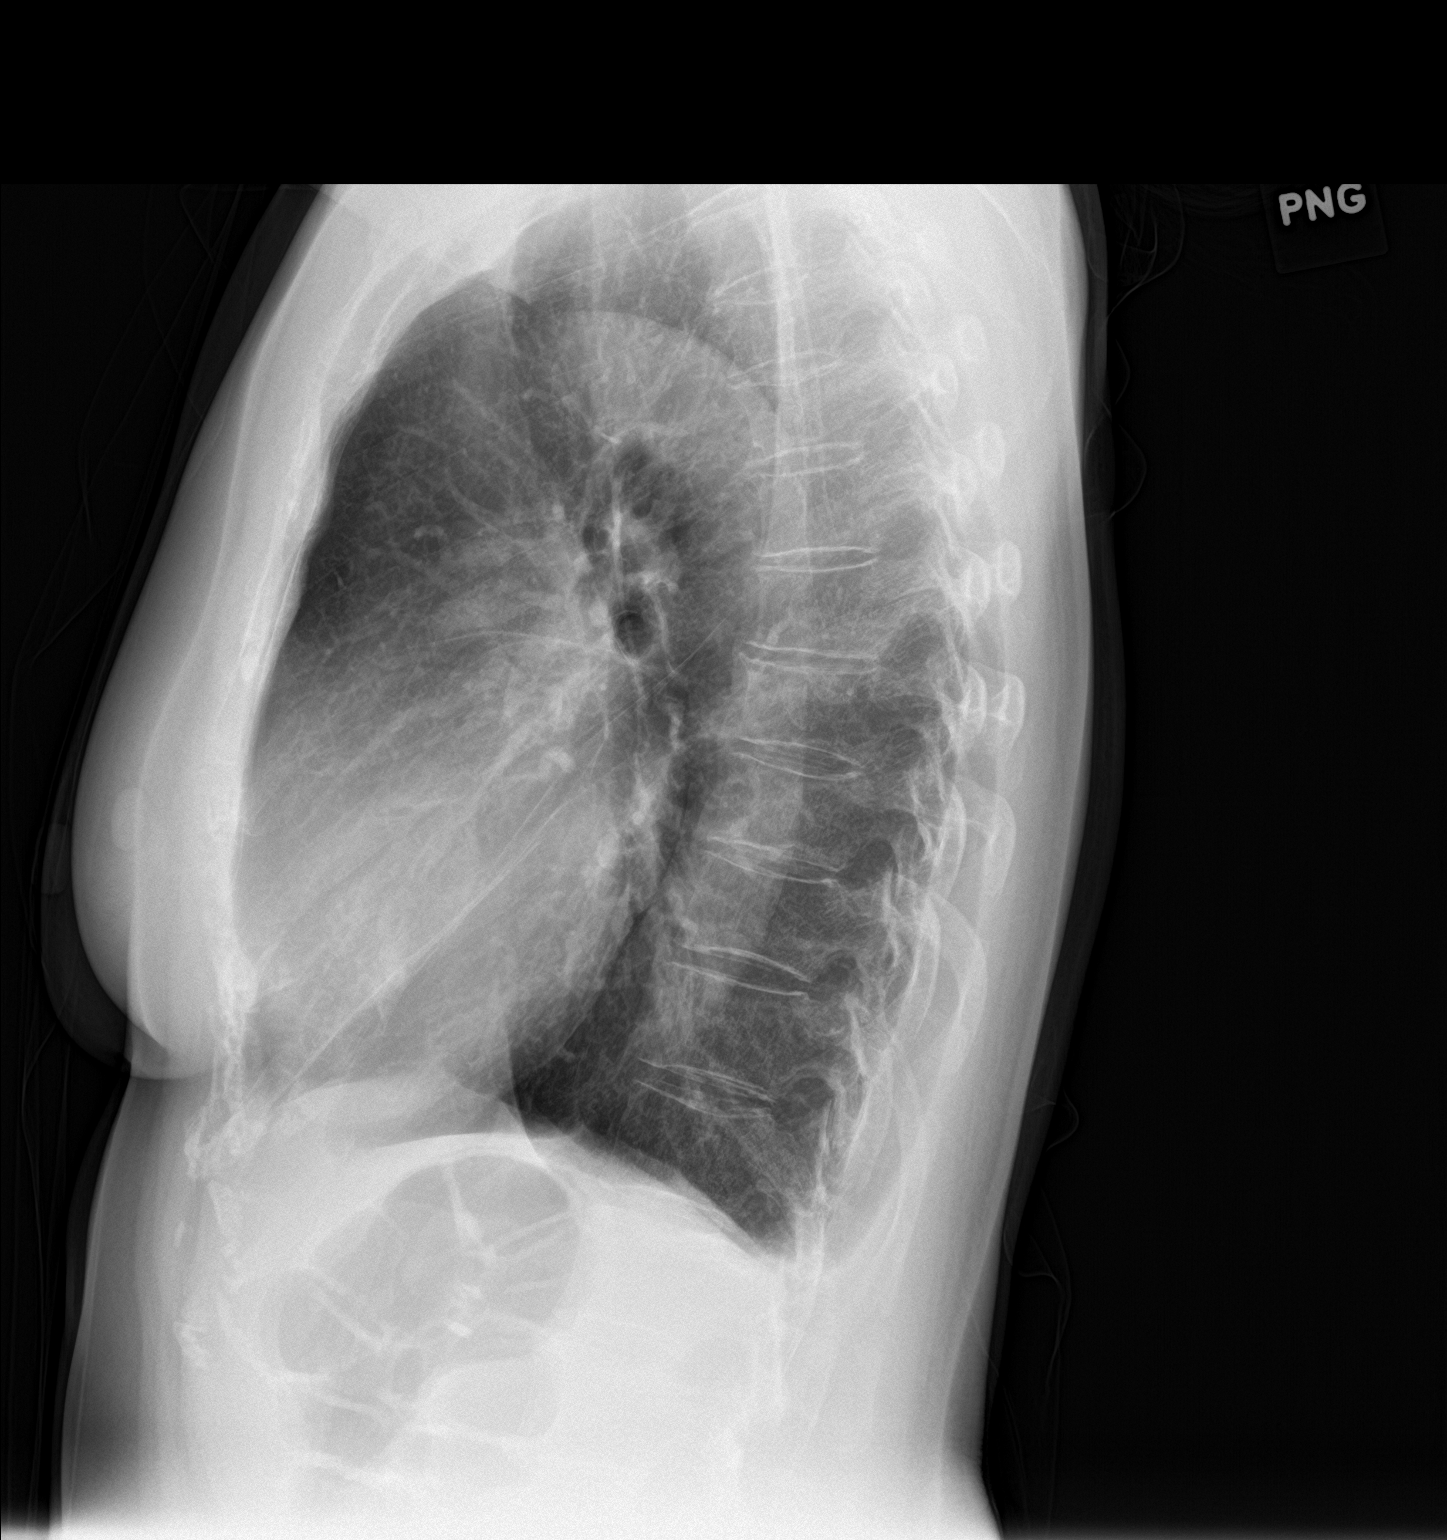

[2 of 2 positions shown; findings below may reference images not displayed]

The lungs are mildly
hyperinflated with hemidiaphragm flattening. The heart and pulmonary
vascularity are normal. There is calcification in the wall of the
aortic arch. There are small pleural effusions blunting the
costophrenic angles.
FINDINGS: The heart size and mediastinal contours are within normal limits.
Both lungs are clear. The visualized skeletal structures are
unremarkable.
IMPRESSION: Reactive airway disease with superimposed alveolar pneumonia
bilaterally. Tiny pleural effusions bilaterally. Followup PA and
lateral chest X-ray is recommended in 3-4 weeks following trial of
antibiotic therapy to ensure resolution.

Thoracic aortic atherosclerosis.

## 2017-10-10 ENCOUNTER — Other Ambulatory Visit: Payer: Self-pay | Admitting: Family Medicine

## 2017-10-29 ENCOUNTER — Encounter: Payer: Self-pay | Admitting: Family Medicine

## 2017-10-29 ENCOUNTER — Ambulatory Visit (INDEPENDENT_AMBULATORY_CARE_PROVIDER_SITE_OTHER): Payer: Medicare Other | Admitting: Family Medicine

## 2017-10-29 VITALS — BP 110/80 | HR 71 | Temp 98.5°F | Wt 114.1 lb

## 2017-10-29 DIAGNOSIS — Z7185 Encounter for immunization safety counseling: Secondary | ICD-10-CM

## 2017-10-29 DIAGNOSIS — Z23 Encounter for immunization: Secondary | ICD-10-CM | POA: Diagnosis not present

## 2017-10-29 DIAGNOSIS — M75101 Unspecified rotator cuff tear or rupture of right shoulder, not specified as traumatic: Secondary | ICD-10-CM

## 2017-10-29 DIAGNOSIS — Z7189 Other specified counseling: Secondary | ICD-10-CM | POA: Diagnosis not present

## 2017-10-29 NOTE — Patient Instructions (Signed)

## 2017-10-29 NOTE — Progress Notes (Signed)
  Subjective:     Patient ID: Julie Savage, female   DOB: 1950/09/11, 67 y.o.   MRN: [id number]  HPI Seen for the following issues  Immunization counseling. She had Prevnar 13 about a year ago. Needs Pneumovax. No adverse issues with Prevnar. Low risk. She also has questions about new shingles vaccine She had previous zoster backs back in 2013  Right shoulder pain. Concern about possible decreasing range of motion. No injury. No neck pain. No weakness. Pain with abduction and external rotation. Requesting some exercises for her shoulder. Denies any repetitive use.  Past Medical History:  Diagnosis Date  . AMNESIA, TRANSIENT GLOBAL 11/24/2008  . ASTHMA 10/27/2008  . Essential hypertension 10/27/2008  . Heart murmur   . OSTEOPOROSIS 10/27/2008   Past Surgical History:  Procedure Laterality Date  . COLONOSCOPY    . dental implants    . NM MYOVIEW LTD  05/2003   NORMAL STUDY.  EF ~69%. NO ISCHEMIA OR INFARCTION  . TRANSTHORACIC ECHOCARDIOGRAM  11/2008   Normal LV function. Gr 1 DD. Mild MR. Peak PAP ~35 mmHg    reports that she has never smoked. She has never used smokeless tobacco. She reports that she drinks alcohol. She reports that she does not use drugs. family history includes Cancer in her sister; Colon cancer (age of onset: 57) in her brother; Diabetes in her brother and brother; Heart disease (age of onset: 34) in her mother; Hyperlipidemia in her sister; Hypertension in her brother and sister; Lung cancer (age of onset: 10) in her father. Allergies  Allergen Reactions  . Aspirin Rash  . Penicillins Rash     Review of Systems  Constitutional: Negative for appetite change, chills, fever and unexpected weight change.  Respiratory: Negative for shortness of breath.   Cardiovascular: Negative for chest pain.  Musculoskeletal: Negative for neck pain.  Neurological: Negative for weakness, numbness and headaches.       Objective:   Physical Exam  Constitutional: She appears  well-developed and well-nourished.  Cardiovascular: Normal rate and regular rhythm.  Pulmonary/Chest: Effort normal and breath sounds normal. She has no wheezes. She has no rales.  Musculoskeletal: She exhibits no edema.  Right shoulder reveals good range of motion. She has some mild pain with abduction. No localized tenderness       Assessment:     #1 immunization counseling. Patient needs Pneumovax. Had Prevnar 13 year ago. She has decided against new shingles vaccine after discussion of risk and benefits   #2 right shoulder pain. Suspect rotator cuff tendinitis     Plan:     -Pneumovax given -Patient will let us know she changes her mind regarding shingrix -Patient was given Thera-Band along with some home exercises for rotator cuff strengthening and stretching  Eulas Post MD [facility]

## 2017-10-29 NOTE — Addendum Note (Signed)
Addended by: Westley Hummer B on: 10/29/2017 09:04 AM   Modules accepted: Orders

## 2017-11-07 ENCOUNTER — Other Ambulatory Visit: Payer: Self-pay

## 2018-01-02 DIAGNOSIS — R739 Hyperglycemia, unspecified: Secondary | ICD-10-CM | POA: Diagnosis not present

## 2018-01-02 DIAGNOSIS — I359 Nonrheumatic aortic valve disorder, unspecified: Secondary | ICD-10-CM | POA: Diagnosis not present

## 2018-01-02 DIAGNOSIS — Z23 Encounter for immunization: Secondary | ICD-10-CM | POA: Diagnosis not present

## 2018-01-02 DIAGNOSIS — M816 Localized osteoporosis [Lequesne]: Secondary | ICD-10-CM | POA: Diagnosis not present

## 2018-01-02 DIAGNOSIS — I10 Essential (primary) hypertension: Secondary | ICD-10-CM | POA: Diagnosis not present

## 2018-01-02 DIAGNOSIS — Z79899 Other long term (current) drug therapy: Secondary | ICD-10-CM | POA: Diagnosis not present

## 2018-07-29 ENCOUNTER — Telehealth: Payer: Self-pay | Admitting: *Deleted

## 2018-07-29 NOTE — Telephone Encounter (Signed)
Called to schedule awv no answer unable to leave message

## 2018-08-15 ENCOUNTER — Encounter: Payer: Self-pay | Admitting: Internal Medicine

## 2020-05-07 ENCOUNTER — Telehealth: Payer: Self-pay | Admitting: Family Medicine

## 2020-05-07 NOTE — Telephone Encounter (Signed)
Tried calling patient to  schedule Medicare Annual Wellness Visit (AWV) either virtually or in office  No answer.   Last AWV no information please schedule at anytime with [facility] Nurse Health Advisor 1 or 2   This should be a 45 minute visit.  Patient also needs appointment with PCP last appointment 10/2017

## 2020-06-22 ENCOUNTER — Telehealth: Payer: Self-pay | Admitting: Family Medicine

## 2020-06-22 NOTE — Telephone Encounter (Signed)
Tried calling patient to schedule Medicare Annual Wellness Visit (AWV) either virtually or in office.  No answer   AWVI  please schedule at anytime with [facility] Nurse Health Advisor 1 or 2   This should be a 45 minute visit. Patient also needs appointment with PCP last appointment  10/29/17

## 2020-09-13 ENCOUNTER — Telehealth: Payer: Self-pay | Admitting: Family Medicine

## 2020-09-13 NOTE — Telephone Encounter (Signed)
Patient spouse called back to inform office that patient has moved out of the [place].

## 2020-09-13 NOTE — Telephone Encounter (Signed)
Tried calling patient to  schedule Medicare Annual Wellness Visit (AWV) either virtually or in office. No answer  AWV-I PER [place] 07/09/17  please schedule at anytime with [facility] Nurse Health Advisor 1 or 2  Patient also needs appointment with PCP last appointment 10/29/17  This should be a 45 minute visit.

## 2020-10-19 ENCOUNTER — Telehealth: Payer: Self-pay | Admitting: Family Medicine

## 2020-10-19 NOTE — Telephone Encounter (Signed)
Tried calling patient to schedule Medicare Annual Wellness Visit (AWV) either virtually or in office.  No answer   LAWV-I PER [place] 07/09/17 please schedule at anytime with [facility] Nurse Health Advisor 1 or 2   This should be a 45 minute visit.
